# Patient Record
Sex: Female | Born: 1950
Health system: Southern US, Community
[De-identification: ages and names within clinical notes are randomized; demographics above are authoritative.]

## PROBLEM LIST (undated history)

## (undated) ENCOUNTER — Ambulatory Visit: Admission: EM | Payer: BC Managed Care – PPO | Source: Home / Self Care

## (undated) DIAGNOSIS — F32A Depression, unspecified: Secondary | ICD-10-CM

## (undated) DIAGNOSIS — J449 Chronic obstructive pulmonary disease, unspecified: Secondary | ICD-10-CM

## (undated) DIAGNOSIS — I10 Essential (primary) hypertension: Secondary | ICD-10-CM

## (undated) DIAGNOSIS — G2581 Restless legs syndrome: Secondary | ICD-10-CM

## (undated) DIAGNOSIS — E079 Disorder of thyroid, unspecified: Secondary | ICD-10-CM

## (undated) DIAGNOSIS — F419 Anxiety disorder, unspecified: Secondary | ICD-10-CM

## (undated) HISTORY — DX: Disorder of thyroid, unspecified: E07.9

## (undated) HISTORY — DX: Essential (primary) hypertension: I10

## (undated) HISTORY — DX: Restless legs syndrome: G25.81

---

## 2005-11-03 ENCOUNTER — Ambulatory Visit: Payer: Self-pay | Admitting: Family Medicine

## 2007-10-25 ENCOUNTER — Ambulatory Visit: Payer: Self-pay | Admitting: Family Medicine

## 2008-04-15 ENCOUNTER — Ambulatory Visit: Payer: Self-pay | Admitting: Family Medicine

## 2008-09-30 ENCOUNTER — Ambulatory Visit: Payer: Self-pay | Admitting: Internal Medicine

## 2008-10-07 ENCOUNTER — Ambulatory Visit: Payer: Self-pay | Admitting: Internal Medicine

## 2009-07-16 ENCOUNTER — Emergency Department: Payer: Self-pay | Admitting: Unknown Physician Specialty

## 2010-01-12 ENCOUNTER — Ambulatory Visit: Payer: Self-pay | Admitting: Family Medicine

## 2010-01-21 ENCOUNTER — Ambulatory Visit: Payer: Self-pay | Admitting: Family Medicine

## 2010-02-12 ENCOUNTER — Ambulatory Visit: Payer: Self-pay | Admitting: Family Medicine

## 2011-02-22 ENCOUNTER — Ambulatory Visit: Payer: Self-pay | Admitting: Family Medicine

## 2011-03-22 ENCOUNTER — Ambulatory Visit: Payer: Self-pay | Admitting: Family Medicine

## 2011-04-05 HISTORY — PX: COLONOSCOPY: SHX174

## 2011-08-18 ENCOUNTER — Ambulatory Visit: Payer: Self-pay | Admitting: Family Medicine

## 2011-10-19 ENCOUNTER — Emergency Department: Payer: Self-pay | Admitting: Unknown Physician Specialty

## 2011-10-19 LAB — COMPREHENSIVE METABOLIC PANEL
Albumin: 4.1 g/dL (ref 3.4–5.0)
Anion Gap: 14 (ref 7–16)
BUN: 14 mg/dL (ref 7–18)
Co2: 22 mmol/L (ref 21–32)
Creatinine: 1.08 mg/dL (ref 0.60–1.30)
EGFR (African American): >60
EGFR (Non-African Amer.): 56 — ABNORMAL LOW
Glucose: 123 mg/dL — ABNORMAL HIGH (ref 65–99)
Osmolality: 279 (ref 275–301)
Potassium: 3.9 mmol/L (ref 3.5–5.1)
SGOT(AST): 18 U/L (ref 15–37)
SGPT (ALT): 24 U/L
Total Protein: 7.5 g/dL (ref 6.4–8.2)

## 2011-10-19 LAB — URINALYSIS, COMPLETE
Bilirubin,UR: NEGATIVE
Glucose,UR: NEGATIVE mg/dL (ref 0–75)
Nitrite: POSITIVE
Protein: NEGATIVE
Specific Gravity: 1.013 (ref 1.003–1.030)
Squamous Epithelial: 3

## 2011-10-19 LAB — CBC
MCH: 31.7 pg (ref 26.0–34.0)
MCHC: 33.2 g/dL (ref 32.0–36.0)
MCV: 95 fL (ref 80–100)
RBC: 4.19 10*6/uL (ref 3.80–5.20)
RDW: 12.5 % (ref 11.5–14.5)

## 2011-10-20 LAB — CK TOTAL AND CKMB (NOT AT ARMC): CK, Total: 68 U/L (ref 21–215)

## 2011-10-20 LAB — TSH: Thyroid Stimulating Horm: 0.295 u[IU]/mL — ABNORMAL LOW

## 2011-10-20 LAB — TROPONIN I: Troponin-I: <0.02 ng/mL

## 2012-05-03 ENCOUNTER — Ambulatory Visit: Payer: Self-pay | Admitting: Family Medicine

## 2012-06-18 ENCOUNTER — Ambulatory Visit: Payer: Self-pay | Admitting: Family Medicine

## 2013-08-20 ENCOUNTER — Ambulatory Visit: Payer: Self-pay | Admitting: Family Medicine

## 2013-09-16 ENCOUNTER — Ambulatory Visit: Payer: Self-pay | Admitting: Unknown Physician Specialty

## 2013-09-19 LAB — PATHOLOGY REPORT

## 2013-10-25 ENCOUNTER — Ambulatory Visit: Payer: Self-pay | Admitting: Unknown Physician Specialty

## 2014-05-22 ENCOUNTER — Ambulatory Visit: Payer: Self-pay | Admitting: Family Medicine

## 2014-09-04 ENCOUNTER — Other Ambulatory Visit: Payer: Self-pay | Admitting: Family Medicine

## 2014-11-03 ENCOUNTER — Other Ambulatory Visit: Payer: Self-pay | Admitting: Family Medicine

## 2014-11-03 DIAGNOSIS — F17200 Nicotine dependence, unspecified, uncomplicated: Secondary | ICD-10-CM

## 2014-11-29 ENCOUNTER — Other Ambulatory Visit: Payer: Self-pay | Admitting: Family Medicine

## 2014-11-29 DIAGNOSIS — G2581 Restless legs syndrome: Secondary | ICD-10-CM

## 2014-11-29 DIAGNOSIS — Z716 Tobacco abuse counseling: Secondary | ICD-10-CM

## 2014-12-24 ENCOUNTER — Other Ambulatory Visit: Payer: Self-pay | Admitting: Family Medicine

## 2014-12-24 DIAGNOSIS — E039 Hypothyroidism, unspecified: Secondary | ICD-10-CM

## 2014-12-27 ENCOUNTER — Other Ambulatory Visit: Payer: Self-pay | Admitting: Family Medicine

## 2014-12-27 DIAGNOSIS — Z716 Tobacco abuse counseling: Secondary | ICD-10-CM

## 2014-12-28 ENCOUNTER — Other Ambulatory Visit: Payer: Self-pay | Admitting: Family Medicine

## 2014-12-28 DIAGNOSIS — G2581 Restless legs syndrome: Secondary | ICD-10-CM

## 2015-01-03 ENCOUNTER — Other Ambulatory Visit: Payer: Self-pay | Admitting: Family Medicine

## 2015-01-03 DIAGNOSIS — I10 Essential (primary) hypertension: Secondary | ICD-10-CM

## 2015-01-13 ENCOUNTER — Other Ambulatory Visit: Payer: Self-pay | Admitting: Family Medicine

## 2015-01-21 ENCOUNTER — Other Ambulatory Visit: Payer: Self-pay | Admitting: Family Medicine

## 2015-01-26 ENCOUNTER — Other Ambulatory Visit: Payer: Self-pay | Admitting: Family Medicine

## 2015-01-27 ENCOUNTER — Ambulatory Visit (INDEPENDENT_AMBULATORY_CARE_PROVIDER_SITE_OTHER): Payer: PRIVATE HEALTH INSURANCE | Admitting: Family Medicine

## 2015-01-27 ENCOUNTER — Encounter: Payer: Self-pay | Admitting: Family Medicine

## 2015-01-27 VITALS — BP 120/70 | HR 80 | Ht 66.0 in | Wt 143.0 lb

## 2015-01-27 DIAGNOSIS — G2581 Restless legs syndrome: Secondary | ICD-10-CM | POA: Insufficient documentation

## 2015-01-27 DIAGNOSIS — M5030 Other cervical disc degeneration, unspecified cervical region: Secondary | ICD-10-CM | POA: Diagnosis not present

## 2015-01-27 DIAGNOSIS — I10 Essential (primary) hypertension: Secondary | ICD-10-CM

## 2015-01-27 DIAGNOSIS — E039 Hypothyroidism, unspecified: Secondary | ICD-10-CM | POA: Diagnosis not present

## 2015-01-27 DIAGNOSIS — Z23 Encounter for immunization: Secondary | ICD-10-CM

## 2015-01-27 MED ORDER — PRAMIPEXOLE DIHYDROCHLORIDE 0.5 MG PO TABS: ORAL_TABLET | ORAL | Status: DC

## 2015-01-27 MED ORDER — METOPROLOL SUCCINATE ER 100 MG PO TB24: 100.0000 mg | ORAL_TABLET | Freq: Every day | ORAL | Status: DC

## 2015-01-27 MED ORDER — CYCLOBENZAPRINE HCL 10 MG PO TABS: 10.0000 mg | ORAL_TABLET | Freq: Every day | ORAL | Status: DC

## 2015-01-27 MED ORDER — LEVOTHYROXINE SODIUM 75 MCG PO TABS: ORAL_TABLET | ORAL | Status: DC

## 2015-01-27 NOTE — Patient Instructions (Signed)
Smoking Cessation, Tips for Success If you are ready to quit smoking, congratulations! You have chosen to help yourself be healthier. Cigarettes bring nicotine, tar, carbon monoxide, and other irritants into your body. Your lungs, heart, and blood vessels will be able to work better without these poisons. There are many different ways to quit smoking. Nicotine gum, nicotine patches, a nicotine inhaler, or nicotine nasal spray can help with physical craving. Hypnosis, support groups, and medicines help break the habit of smoking. WHAT THINGS CAN I DO TO MAKE QUITTING EASIER?  Here are some tips to help you quit for good:  Pick a date when you will quit smoking completely. Tell all of your friends and family about your plan to quit on that date.  Do not try to slowly cut down on the number of cigarettes you are smoking. Pick a quit date and quit smoking completely starting on that day.  Throw away all cigarettes.   Clean and remove all ashtrays from your home, work, and car.  On a card, write down your reasons for quitting. Carry the card with you and read it when you get the urge to smoke.  Cleanse your body of nicotine. Drink enough water and fluids to keep your urine clear or pale yellow. Do this after quitting to flush the nicotine from your body.  Learn to predict your moods. Do not let a bad situation be your excuse to have a cigarette. Some situations in your life might tempt you into wanting a cigarette.  Never have "just one" cigarette. It leads to wanting another and another. Remind yourself of your decision to quit.  Change habits associated with smoking. If you smoked while driving or when feeling stressed, try other activities to replace smoking. Stand up when drinking your coffee. Brush your teeth after eating. Sit in a different chair when you read the paper. Avoid alcohol while trying to quit, and try to drink fewer caffeinated beverages. Alcohol and caffeine may urge you to  smoke.  Avoid foods and drinks that can trigger a desire to smoke, such as sugary or spicy foods and alcohol.  Ask people who smoke not to smoke around you.  Have something planned to do right after eating or having a cup of coffee. For example, plan to take a walk or exercise.  Try a relaxation exercise to calm you down and decrease your stress. Remember, you may be tense and nervous for the first 2 weeks after you quit, but this will pass.  Find new activities to keep your hands busy. Play with a pen, coin, or rubber band. Doodle or draw things on paper.  Brush your teeth right after eating. This will help cut down on the craving for the taste of tobacco after meals. You can also try mouthwash.   Use oral substitutes in place of cigarettes. Try using lemon drops, carrots, cinnamon sticks, or chewing gum. Keep them handy so they are available when you have the urge to smoke.  When you have the urge to smoke, try deep breathing.  Designate your home as a nonsmoking area.  If you are a heavy smoker, ask your health care provider about a prescription for nicotine chewing gum. It can ease your withdrawal from nicotine.  Reward yourself. Set aside the cigarette money you save and buy yourself something nice.  Look for support from others. Join a support group or smoking cessation program. Ask someone at home or at work to help you with your plan   to quit smoking.  Always ask yourself, "Do I need this cigarette or is this just a reflex?" Tell yourself, "Today, I choose not to smoke," or "I do not want to smoke." You are reminding yourself of your decision to quit.  Do not replace cigarette smoking with electronic cigarettes (commonly called e-cigarettes). The safety of e-cigarettes is unknown, and some may contain harmful chemicals.  If you relapse, do not give up! Plan ahead and think about what you will do the next time you get the urge to smoke. HOW WILL I FEEL WHEN I QUIT SMOKING? You  may have symptoms of withdrawal because your body is used to nicotine (the addictive substance in cigarettes). You may crave cigarettes, be irritable, feel very hungry, cough often, get headaches, or have difficulty concentrating. The withdrawal symptoms are only temporary. They are strongest when you first quit but will go away within 10-14 days. When withdrawal symptoms occur, stay in control. Think about your reasons for quitting. Remind yourself that these are signs that your body is healing and getting used to being without cigarettes. Remember that withdrawal symptoms are easier to treat than the major diseases that smoking can cause.  Even after the withdrawal is over, expect periodic urges to smoke. However, these cravings are generally short lived and will go away whether you smoke or not. Do not smoke! WHAT RESOURCES ARE AVAILABLE TO HELP ME QUIT SMOKING? Your health care provider can direct you to community resources or hospitals for support, which may include:  Group support.  Education.  Hypnosis.  Therapy.   This information is not intended to replace advice given to you by your health care provider. Make sure you discuss any questions you have with your health care provider.   Document Released: 12/18/2003 Document Revised: 04/11/2014 Document Reviewed: 09/06/2012 Elsevier Interactive Patient Education 2016 [employer]  

## 2015-01-27 NOTE — Progress Notes (Signed)
Name: Diane Glenn [facility]   MRN: [id number]    DOB: [birth year]   Date:01/27/2015       Progress Note  Subjective  Chief Complaint  Chief Complaint  Patient presents with  . Hypertension  . Hypothyroidism  . restless leg    Hypertension This is a chronic problem. The current episode started more than 1 year ago. The problem has been waxing and waning since onset. The problem is controlled. Pertinent negatives include no anxiety, blurred vision, chest pain, headaches, malaise/fatigue, neck pain, orthopnea, palpitations, peripheral edema, PND, shortness of breath or sweats. There are no associated agents to hypertension. There are no known risk factors for coronary artery disease. Past treatments include beta blockers. The current treatment provides moderate improvement. There are no compliance problems.  Hypertensive end-organ damage includes a thyroid problem. There is no history of angina, kidney disease, CAD/MI, CVA, heart failure, left ventricular hypertrophy, PVD, renovascular disease or retinopathy. There is no history of chronic renal disease.  Thyroid Problem Presents for follow-up visit. Patient reports no anxiety, cold intolerance, constipation, depressed mood, diaphoresis, diarrhea, dry skin, fatigue, hair loss, heat intolerance, hoarse voice, leg swelling, menstrual problem, nail problem, palpitations, tremors, visual change, weight gain or weight loss. The symptoms have been stable. Past treatments include levothyroxine. There is no history of atrial fibrillation, dementia, diabetes, Graves' ophthalmopathy, heart failure, hyperlipidemia, neuropathy, obesity or osteopenia.  Other This is a chronic problem. The current episode started more than 1 year ago. Pertinent negatives include no abdominal pain, arthralgias, chest pain, chills, congestion, coughing, diaphoresis, fatigue, fever, headaches, myalgias, nausea, neck pain, rash, sore throat or visual change. The treatment provided mild  relief.  Back Pain This is a new problem. The current episode started 1 to 4 weeks ago. The problem has been gradually worsening since onset. Pain location: cervical spine. The quality of the pain is described as aching. The pain is at a severity of 2/10. Stiffness is present all day. Pertinent negatives include no abdominal pain, chest pain, dysuria, fever, headaches, tingling or weight loss.    No problem-specific assessment & plan notes found for this encounter.   Past Medical History  Diagnosis Date  . Thyroid disease   . Hypertension   . Restless leg syndrome     Past Surgical History  Procedure Laterality Date  . Colonoscopy  2013    cleared for 10 yrs- Dr Vira Agar?    Family History  Problem Relation Age of Onset  . Diabetes Father   . Stroke Father     Social History   Social History  . Marital Status: Married    Spouse Name: N/A  . Number of Children: N/A  . Years of Education: N/A   Occupational History  . Not on file.   Social History Main Topics  . Smoking status: Current Every Day Smoker  . Smokeless tobacco: Not on file  . Alcohol Use: No  . Drug Use: No  . Sexual Activity: Yes   Other Topics Concern  . Not on file   Social History Narrative  . No narrative on file    No Known Allergies   Review of Systems  Constitutional: Negative for fever, chills, weight loss, weight gain, malaise/fatigue, diaphoresis and fatigue.  HENT: Negative for congestion, ear discharge, ear pain, hoarse voice and sore throat.   Eyes: Negative for blurred vision.  Respiratory: Negative for cough, sputum production, shortness of breath and wheezing.   Cardiovascular: Negative for chest pain, palpitations, orthopnea, leg swelling  and PND.  Gastrointestinal: Negative for heartburn, nausea, abdominal pain, diarrhea, constipation, blood in stool and melena.  Genitourinary: Negative for dysuria, urgency, frequency, hematuria and menstrual problem.  Musculoskeletal:  Negative for myalgias, back pain, joint pain, arthralgias and neck pain.  Skin: Negative for rash.  Neurological: Negative for dizziness, tingling, tremors, sensory change, focal weakness and headaches.  Endo/Heme/Allergies: Negative for environmental allergies, cold intolerance, heat intolerance and polydipsia. Does not bruise/bleed easily.  Psychiatric/Behavioral: Negative for depression and suicidal ideas. The patient is not nervous/anxious and does not have insomnia.      Objective  Filed Vitals:   01/27/15 1346  BP: 120/70  Pulse: 80  Height: 5\' 6"  (1.676 m)  Weight: 143 lb (64.864 kg)    Physical Exam  Constitutional: She is well-developed, well-nourished, and in no distress. No distress.  HENT:  Head: Normocephalic and atraumatic.  Right Ear: External ear normal.  Left Ear: External ear normal.  Nose: Nose normal.  Mouth/Throat: Oropharynx is clear and moist.  Eyes: Conjunctivae and EOM are normal. Pupils are equal, round, and reactive to light. Right eye exhibits no discharge. Left eye exhibits no discharge.  Neck: Normal range of motion. Neck supple. No JVD present. No thyromegaly present.  Cardiovascular: Normal rate, regular rhythm, normal heart sounds and intact distal pulses.  Exam reveals no gallop and no friction rub.   No murmur heard. Pulmonary/Chest: Effort normal and breath sounds normal.  Abdominal: Soft. Bowel sounds are normal. She exhibits no mass. There is no tenderness. There is no guarding.  Musculoskeletal: Normal range of motion. She exhibits no edema.  Lymphadenopathy:    She has no cervical adenopathy.  Neurological: She is alert. She has normal sensation, normal strength and normal reflexes.  Skin: Skin is warm and dry. She is not diaphoretic.  Psychiatric: Mood and affect normal.  Nursing note and vitals reviewed.     Assessment & Plan  Problem List Items Addressed This Visit      Cardiovascular and Mediastinum   Essential hypertension -  Primary   Relevant Medications   metoprolol succinate (TOPROL-XL) 100 MG 24 hr tablet   Other Relevant Orders   Renal Function Panel     Endocrine   Thyroid activity decreased   Relevant Medications   levothyroxine (SYNTHROID, LEVOTHROID) 75 MCG tablet   metoprolol succinate (TOPROL-XL) 100 MG 24 hr tablet   Other Relevant Orders   TSH     Musculoskeletal and Integument   Degenerative disc disease, cervical   Relevant Medications   cyclobenzaprine (FLEXERIL) 10 MG tablet     Other   Restless legs syndrome   Relevant Medications   pramipexole (MIRAPEX) 0.5 MG tablet    Other Visit Diagnoses    Need for influenza vaccination        Relevant Orders    Flu Vaccine QUAD 36+ mos PF IM (Fluarix & Fluzone Quad PF) (Completed)         Dr. Ladonna Vanorder [facility]  01/27/2015

## 2015-01-28 LAB — RENAL FUNCTION PANEL
ALBUMIN: 4.5 g/dL (ref 3.6–4.8)
BUN/Creatinine Ratio: 12 (ref 11–26)
BUN: 9 mg/dL (ref 8–27)
CALCIUM: 9.8 mg/dL (ref 8.7–10.3)
CO2: 26 mmol/L (ref 18–29)
CREATININE: 0.76 mg/dL (ref 0.57–1.00)
Chloride: 98 mmol/L (ref 97–106)
GFR calc Af Amer: 97 mL/min/{1.73_m2} (ref >59)
GFR calc non Af Amer: 84 mL/min/{1.73_m2} (ref >59)
Glucose: 92 mg/dL (ref 65–99)
PHOSPHORUS: 3.5 mg/dL (ref 2.5–4.5)
POTASSIUM: 4.7 mmol/L (ref 3.5–5.2)
SODIUM: 140 mmol/L (ref 136–144)

## 2015-01-28 LAB — TSH: TSH: 0.927 u[IU]/mL (ref 0.450–4.500)

## 2015-03-23 ENCOUNTER — Other Ambulatory Visit: Payer: Self-pay | Admitting: Family Medicine

## 2015-03-26 ENCOUNTER — Encounter: Payer: Self-pay | Admitting: Family Medicine

## 2015-03-26 ENCOUNTER — Ambulatory Visit (INDEPENDENT_AMBULATORY_CARE_PROVIDER_SITE_OTHER): Payer: PRIVATE HEALTH INSURANCE | Admitting: Family Medicine

## 2015-03-26 VITALS — BP 130/80 | HR 64 | Ht 66.0 in | Wt 148.0 lb

## 2015-03-26 DIAGNOSIS — J40 Bronchitis, not specified as acute or chronic: Secondary | ICD-10-CM

## 2015-03-26 MED ORDER — AMOXICILLIN 500 MG PO CAPS
500.0000 mg | ORAL_CAPSULE | Freq: Three times a day (TID) | ORAL | Status: DC
Start: 2015-03-26 — End: 2015-07-16

## 2015-03-26 NOTE — Progress Notes (Signed)
Name: Diane Glenn [facility]   MRN: [id number]    DOB: 05-04-1950   Date:03/26/2015       Progress Note  Subjective  Chief Complaint  Chief Complaint  Patient presents with  . Sinusitis    drainage, runny nose, cough- clear/ thick production    Sinusitis This is a new problem. The current episode started 1 to 4 weeks ago. The problem has been waxing and waning since onset. There has been no fever (low grade earlier). The pain is mild. Associated symptoms include chills, congestion, headaches, sinus pressure and a sore throat. Pertinent negatives include no coughing, diaphoresis, ear pain, hoarse voice, neck pain or shortness of breath. Past treatments include nothing. The treatment provided no relief.    No problem-specific assessment & plan notes found for this encounter.   Past Medical History  Diagnosis Date  . Thyroid disease   . Hypertension   . Restless leg syndrome     Past Surgical History  Procedure Laterality Date  . Colonoscopy  2013    cleared for 10 yrs- Dr Vira Agar?    Family History  Problem Relation Age of Onset  . Diabetes Father   . Stroke Father     Social History   Social History  . Marital Status: Married    Spouse Name: N/A  . Number of Children: N/A  . Years of Education: N/A   Occupational History  . Not on file.   Social History Main Topics  . Smoking status: Current Every Day Smoker  . Smokeless tobacco: Not on file  . Alcohol Use: No  . Drug Use: No  . Sexual Activity: Yes   Other Topics Concern  . Not on file   Social History Narrative    No Known Allergies   Review of Systems  Constitutional: Positive for chills. Negative for fever, weight loss, malaise/fatigue and diaphoresis.  HENT: Positive for congestion, sinus pressure and sore throat. Negative for ear discharge, ear pain and hoarse voice.   Eyes: Negative for blurred vision.  Respiratory: Negative for cough, sputum production, shortness of breath and wheezing.    Cardiovascular: Negative for chest pain, palpitations and leg swelling.  Gastrointestinal: Negative for heartburn, nausea, abdominal pain, diarrhea, constipation, blood in stool and melena.  Genitourinary: Negative for dysuria, urgency, frequency and hematuria.  Musculoskeletal: Negative for myalgias, back pain, joint pain and neck pain.  Skin: Negative for rash.  Neurological: Positive for headaches. Negative for dizziness, tingling, sensory change and focal weakness.  Endo/Heme/Allergies: Negative for environmental allergies and polydipsia. Does not bruise/bleed easily.  Psychiatric/Behavioral: Negative for depression and suicidal ideas. The patient is not nervous/anxious and does not have insomnia.      Objective  Filed Vitals:   03/26/15 0936  BP: 130/80  Pulse: 64  Height: 5\' 6"  (1.676 m)  Weight: 148 lb (67.132 kg)    Physical Exam  Constitutional: She is well-developed, well-nourished, and in no distress. No distress.  HENT:  Head: Normocephalic and atraumatic.  Right Ear: External ear normal.  Left Ear: External ear normal.  Nose: Nose normal.  Mouth/Throat: Oropharynx is clear and moist.  Eyes: Conjunctivae and EOM are normal. Pupils are equal, round, and reactive to light. Right eye exhibits no discharge. Left eye exhibits no discharge.  Neck: Normal range of motion. Neck supple. No JVD present. No thyromegaly present.  Cardiovascular: Normal rate, regular rhythm, normal heart sounds and intact distal pulses.  Exam reveals no gallop and no friction rub.   No murmur  heard. Pulmonary/Chest: Effort normal and breath sounds normal.  Abdominal: Soft. Bowel sounds are normal. She exhibits no mass. There is no tenderness. There is no guarding.  Musculoskeletal: Normal range of motion. She exhibits no edema.  Lymphadenopathy:    She has no cervical adenopathy.  Neurological: She is alert. She has normal reflexes.  Skin: Skin is warm and dry. She is not diaphoretic.   Psychiatric: Mood and affect normal.  Nursing note and vitals reviewed.     Assessment & Plan  Problem List Items Addressed This Visit    None    Visit Diagnoses    Bronchitis    -  Primary    Relevant Medications    amoxicillin (AMOXIL) 500 MG capsule         Dr. [facility]  03/26/2015

## 2015-03-27 ENCOUNTER — Other Ambulatory Visit: Payer: Self-pay | Admitting: Family Medicine

## 2015-06-14 ENCOUNTER — Other Ambulatory Visit: Payer: Self-pay | Admitting: Family Medicine

## 2015-06-19 ENCOUNTER — Other Ambulatory Visit: Payer: Self-pay | Admitting: Family Medicine

## 2015-07-16 ENCOUNTER — Ambulatory Visit (INDEPENDENT_AMBULATORY_CARE_PROVIDER_SITE_OTHER): Payer: PRIVATE HEALTH INSURANCE | Admitting: Family Medicine

## 2015-07-16 ENCOUNTER — Encounter: Payer: Self-pay | Admitting: Family Medicine

## 2015-07-16 VITALS — BP 130/78 | HR 68 | Ht 66.0 in | Wt 147.0 lb

## 2015-07-16 DIAGNOSIS — G2581 Restless legs syndrome: Secondary | ICD-10-CM

## 2015-07-16 DIAGNOSIS — J0100 Acute maxillary sinusitis, unspecified: Secondary | ICD-10-CM | POA: Diagnosis not present

## 2015-07-16 DIAGNOSIS — J40 Bronchitis, not specified as acute or chronic: Secondary | ICD-10-CM

## 2015-07-16 DIAGNOSIS — E785 Hyperlipidemia, unspecified: Secondary | ICD-10-CM | POA: Diagnosis not present

## 2015-07-16 DIAGNOSIS — E039 Hypothyroidism, unspecified: Secondary | ICD-10-CM | POA: Diagnosis not present

## 2015-07-16 DIAGNOSIS — I10 Essential (primary) hypertension: Secondary | ICD-10-CM

## 2015-07-16 DIAGNOSIS — M5030 Other cervical disc degeneration, unspecified cervical region: Secondary | ICD-10-CM | POA: Diagnosis not present

## 2015-07-16 MED ORDER — LEVOTHYROXINE SODIUM 75 MCG PO TABS: ORAL_TABLET | ORAL | Status: DC

## 2015-07-16 MED ORDER — AMOXICILLIN 500 MG PO CAPS: 500.0000 mg | ORAL_CAPSULE | Freq: Three times a day (TID) | ORAL | Status: DC

## 2015-07-16 MED ORDER — CYCLOBENZAPRINE HCL 10 MG PO TABS: ORAL_TABLET | ORAL | Status: DC

## 2015-07-16 MED ORDER — PRAMIPEXOLE DIHYDROCHLORIDE 0.5 MG PO TABS: ORAL_TABLET | ORAL | Status: DC

## 2015-07-16 MED ORDER — METOPROLOL SUCCINATE ER 100 MG PO TB24: 100.0000 mg | ORAL_TABLET | Freq: Every day | ORAL | Status: DC

## 2015-07-16 NOTE — Progress Notes (Signed)
Name: Diane Glenn [facility]   MRN: [id number]    DOB: Mar 27, 1951   Date:07/16/2015       Progress Note  Subjective  Chief Complaint  Chief Complaint  Patient presents with  . Hypertension  . Hypothyroidism  . restless leg  . Sinusitis    cough and cong    HPI Comments: Patient presents for medication refills for RLS and chronic cervical strain.  Hypertension This is a chronic problem. The current episode started more than 1 year ago. The problem has been gradually improving since onset. The problem is controlled. Associated symptoms include malaise/fatigue. Pertinent negatives include no anxiety, blurred vision, chest pain, headaches, neck pain, orthopnea, palpitations, peripheral edema, PND, shortness of breath or sweats. There are no associated agents to hypertension. There are no known risk factors for coronary artery disease. Past treatments include beta blockers. The current treatment provides moderate improvement. There are no compliance problems.  Hypertensive end-organ damage includes a thyroid problem. There is no history of angina, kidney disease, CAD/MI, CVA, heart failure, left ventricular hypertrophy, PVD, renovascular disease or retinopathy. There is no history of chronic renal disease or a hypertension causing med.  Sinusitis This is a new problem. The current episode started more than 1 year ago. The problem has been waxing and waning since onset. There has been no fever. The pain is mild. Associated symptoms include chills, congestion, coughing, sinus pressure and sneezing. Pertinent negatives include no diaphoresis, ear pain, headaches, hoarse voice, neck pain, shortness of breath or sore throat. Past treatments include acetaminophen and oral decongestants. The treatment provided mild relief.  Thyroid Problem Presents for follow-up visit. Symptoms include fatigue. Patient reports no anxiety, cold intolerance, constipation, depressed mood, diaphoresis, diarrhea, dry skin, hair  loss, heat intolerance, hoarse voice, leg swelling, menstrual problem, nail problem, palpitations, tremors, visual change, weight gain or weight loss. The symptoms have been stable. Past treatments include levothyroxine. There is no history of heart failure.    No problem-specific assessment & plan notes found for this encounter.   Past Medical History  Diagnosis Date  . Thyroid disease   . Hypertension   . Restless leg syndrome     Past Surgical History  Procedure Laterality Date  . Colonoscopy  2013    cleared for 10 yrs- Dr Vira Agar?    Family History  Problem Relation Age of Onset  . Diabetes Father   . Stroke Father     Social History   Social History  . Marital Status: Married    Spouse Name: N/A  . Number of Children: N/A  . Years of Education: N/A   Occupational History  . Not on file.   Social History Main Topics  . Smoking status: Current Every Day Smoker  . Smokeless tobacco: Not on file  . Alcohol Use: No  . Drug Use: No  . Sexual Activity: Yes   Other Topics Concern  . Not on file   Social History Narrative    No Known Allergies   Review of Systems  Constitutional: Positive for chills, malaise/fatigue and fatigue. Negative for fever, weight loss, weight gain and diaphoresis.  HENT: Positive for congestion, sinus pressure and sneezing. Negative for ear discharge, ear pain, hoarse voice and sore throat.   Eyes: Negative for blurred vision.  Respiratory: Positive for cough. Negative for sputum production, shortness of breath and wheezing.   Cardiovascular: Negative for chest pain, palpitations, orthopnea, leg swelling and PND.  Gastrointestinal: Negative for heartburn, nausea, abdominal pain, diarrhea, constipation, blood  in stool and melena.  Genitourinary: Negative for dysuria, urgency, frequency, hematuria and menstrual problem.  Musculoskeletal: Negative for myalgias, back pain, joint pain and neck pain.  Skin: Negative for rash.   Neurological: Negative for dizziness, tingling, tremors, sensory change, focal weakness and headaches.  Endo/Heme/Allergies: Negative for environmental allergies, cold intolerance, heat intolerance and polydipsia. Does not bruise/bleed easily.  Psychiatric/Behavioral: Negative for depression and suicidal ideas. The patient is not nervous/anxious and does not have insomnia.      Objective  Filed Vitals:   07/16/15 1423  BP: 130/78  Pulse: 68  Height: 5\' 6"  (1.676 m)  Weight: 147 lb (66.679 kg)    Physical Exam  Constitutional: She is well-developed, well-nourished, and in no distress. No distress.  HENT:  Head: Normocephalic and atraumatic.  Right Ear: Tympanic membrane, external ear and ear canal normal.  Left Ear: Tympanic membrane, external ear and ear canal normal.  Nose: Nose normal.  Mouth/Throat: Oropharynx is clear and moist.  Eyes: Conjunctivae and EOM are normal. Pupils are equal, round, and reactive to light. Right eye exhibits no discharge. Left eye exhibits no discharge.  Neck: Normal range of motion. Neck supple. No JVD present. No thyromegaly present.  Cardiovascular: Normal rate, regular rhythm, normal heart sounds and intact distal pulses.  Exam reveals no gallop and no friction rub.   No murmur heard. Pulmonary/Chest: Effort normal and breath sounds normal.  Abdominal: Soft. Bowel sounds are normal. She exhibits no mass. There is no tenderness. There is no guarding.  Musculoskeletal: Normal range of motion. She exhibits no edema.  Lymphadenopathy:    She has no cervical adenopathy.  Neurological: She is alert.  Skin: Skin is warm and dry. She is not diaphoretic.  Psychiatric: Mood and affect normal.  Nursing note and vitals reviewed.     Assessment & Plan  Problem List Items Addressed This Visit      Cardiovascular and Mediastinum   Essential hypertension - Primary   Relevant Medications   metoprolol succinate (TOPROL-XL) 100 MG 24 hr tablet   Other  Relevant Orders   Renal Function Panel     Endocrine   Thyroid activity decreased   Relevant Medications   levothyroxine (SYNTHROID, LEVOTHROID) 75 MCG tablet   metoprolol succinate (TOPROL-XL) 100 MG 24 hr tablet   Other Relevant Orders   TSH     Musculoskeletal and Integument   Degenerative disc disease, cervical   Relevant Medications   cyclobenzaprine (FLEXERIL) 10 MG tablet     Other   Restless legs syndrome   Relevant Medications   pramipexole (MIRAPEX) 0.5 MG tablet    Other Visit Diagnoses    Acute maxillary sinusitis, recurrence not specified        Relevant Medications    amoxicillin (AMOXIL) 500 MG capsule    Bronchitis        Relevant Medications    amoxicillin (AMOXIL) 500 MG capsule    Hyperlipidemia        Relevant Medications    metoprolol succinate (TOPROL-XL) 100 MG 24 hr tablet    Other Relevant Orders    Lipid Profile    TSH         Dr. Emmajo [facility]  07/16/2015

## 2015-07-17 LAB — RENAL FUNCTION PANEL
Albumin: 4.4 g/dL (ref 3.6–4.8)
BUN/Creatinine Ratio: 13 (ref 12–28)
BUN: 10 mg/dL (ref 8–27)
CO2: 25 mmol/L (ref 18–29)
CREATININE: 0.77 mg/dL (ref 0.57–1.00)
Calcium: 9.6 mg/dL (ref 8.7–10.3)
Chloride: 96 mmol/L (ref 96–106)
GFR calc non Af Amer: 82 mL/min/{1.73_m2} (ref >59)
GFR, EST AFRICAN AMERICAN: 94 mL/min/{1.73_m2} (ref >59)
Glucose: 57 mg/dL — ABNORMAL LOW (ref 65–99)
Phosphorus: 3 mg/dL (ref 2.5–4.5)
Potassium: 4.3 mmol/L (ref 3.5–5.2)
SODIUM: 137 mmol/L (ref 134–144)

## 2015-07-17 LAB — TSH: TSH: 2.76 u[IU]/mL (ref 0.450–4.500)

## 2015-07-17 LAB — LIPID PANEL
Chol/HDL Ratio: 4 ratio units (ref 0.0–4.4)
Cholesterol, Total: 194 mg/dL (ref 100–199)
HDL: 49 mg/dL (ref >39)
LDL CALC: 101 mg/dL — AB (ref 0–99)
Triglycerides: 222 mg/dL — ABNORMAL HIGH (ref 0–149)
VLDL CHOLESTEROL CAL: 44 mg/dL — AB (ref 5–40)

## 2015-07-28 ENCOUNTER — Encounter: Payer: Self-pay | Admitting: Family Medicine

## 2015-07-28 ENCOUNTER — Ambulatory Visit (INDEPENDENT_AMBULATORY_CARE_PROVIDER_SITE_OTHER): Payer: PRIVATE HEALTH INSURANCE | Admitting: Family Medicine

## 2015-07-28 VITALS — BP 130/80 | HR 78 | Ht 66.0 in | Wt 148.0 lb

## 2015-07-28 DIAGNOSIS — E039 Hypothyroidism, unspecified: Secondary | ICD-10-CM | POA: Diagnosis not present

## 2015-07-28 DIAGNOSIS — M5030 Other cervical disc degeneration, unspecified cervical region: Secondary | ICD-10-CM

## 2015-07-28 DIAGNOSIS — I10 Essential (primary) hypertension: Secondary | ICD-10-CM | POA: Diagnosis not present

## 2015-07-28 DIAGNOSIS — G2581 Restless legs syndrome: Secondary | ICD-10-CM | POA: Diagnosis not present

## 2015-07-28 MED ORDER — PRAMIPEXOLE DIHYDROCHLORIDE 0.5 MG PO TABS: ORAL_TABLET | ORAL | Status: DC

## 2015-07-28 MED ORDER — METOPROLOL SUCCINATE ER 100 MG PO TB24: 100.0000 mg | ORAL_TABLET | Freq: Every day | ORAL | Status: DC

## 2015-07-28 MED ORDER — LEVOTHYROXINE SODIUM 75 MCG PO TABS
ORAL_TABLET | ORAL | Status: DC
Start: 2015-07-28 — End: 2016-03-10

## 2015-07-28 MED ORDER — CYCLOBENZAPRINE HCL 10 MG PO TABS: ORAL_TABLET | ORAL | Status: DC

## 2015-07-28 NOTE — Progress Notes (Signed)
Name: Diane Glenn [facility]   MRN: [id number]    DOB: 09-30-1950   Date:07/28/2015       Progress Note  Subjective  Chief Complaint  Chief Complaint  Patient presents with  . Hypertension  . Hypothyroidism  . restless leg  . Spasms    takes Flexeril    HPI Comments: Patient presents for refill medication for restless leg.  Hypertension This is a chronic problem. The current episode started more than 1 year ago. The problem has been gradually improving since onset. The problem is controlled. Pertinent negatives include no anxiety, blurred vision, chest pain, headaches, malaise/fatigue, neck pain, orthopnea, palpitations, peripheral edema, PND, shortness of breath or sweats. There are no associated agents to hypertension. Risk factors for coronary artery disease include smoking/tobacco exposure and dyslipidemia. Past treatments include beta blockers. The current treatment provides mild improvement. There are no compliance problems.  Hypertensive end-organ damage includes a thyroid problem. There is no history of angina, kidney disease, CAD/MI, CVA, heart failure, left ventricular hypertrophy, PVD, renovascular disease or retinopathy. There is no history of chronic renal disease or a hypertension causing med.  Thyroid Problem Presents for follow-up visit. Patient reports no anxiety, cold intolerance, constipation, depressed mood, diaphoresis, diarrhea, dry skin, fatigue, hair loss, heat intolerance, hoarse voice, leg swelling, menstrual problem, nail problem, palpitations, tremors, visual change, weight gain or weight loss. The symptoms have been stable. Past treatments include levothyroxine. The treatment provided mild relief. There is no history of atrial fibrillation, dementia or heart failure.  Muscle Pain This is a recurrent problem. The current episode started more than 1 month ago. The problem has been waxing and waning since onset. The pain is present in the lower back. The pain is mild.  Nothing aggravates the symptoms. Pertinent negatives include no abdominal pain, chest pain, constipation, diarrhea, dysuria, fatigue, fever, headaches, nausea, rash, sensory change, shortness of breath, visual change or wheezing. Treatments tried: muscle relaxant. The treatment provided mild relief.    No problem-specific assessment & plan notes found for this encounter.   Past Medical History  Diagnosis Date  . Thyroid disease   . Hypertension   . Restless leg syndrome     Past Surgical History  Procedure Laterality Date  . Colonoscopy  2013    cleared for 10 yrs- Dr Vira Agar?    Family History  Problem Relation Age of Onset  . Diabetes Father   . Stroke Father     Social History   Social History  . Marital Status: Married    Spouse Name: N/A  . Number of Children: N/A  . Years of Education: N/A   Occupational History  . Not on file.   Social History Main Topics  . Smoking status: Current Every Day Smoker  . Smokeless tobacco: Not on file  . Alcohol Use: No  . Drug Use: No  . Sexual Activity: Yes   Other Topics Concern  . Not on file   Social History Narrative    No Known Allergies   Review of Systems  Constitutional: Negative for fever, chills, weight loss, weight gain, malaise/fatigue, diaphoresis and fatigue.  HENT: Negative for ear discharge, ear pain, hoarse voice and sore throat.   Eyes: Negative for blurred vision.  Respiratory: Negative for cough, sputum production, shortness of breath and wheezing.   Cardiovascular: Negative for chest pain, palpitations, orthopnea, leg swelling and PND.  Gastrointestinal: Negative for heartburn, nausea, abdominal pain, diarrhea, constipation, blood in stool and melena.  Genitourinary: Negative for dysuria,  urgency, frequency, hematuria and menstrual problem.  Musculoskeletal: Positive for back pain. Negative for myalgias, joint pain and neck pain.  Skin: Negative for rash.  Neurological: Negative for dizziness,  tingling, tremors, sensory change, focal weakness and headaches.  Endo/Heme/Allergies: Negative for environmental allergies, cold intolerance, heat intolerance and polydipsia. Does not bruise/bleed easily.  Psychiatric/Behavioral: Negative for depression and suicidal ideas. The patient is not nervous/anxious and does not have insomnia.      Objective  Filed Vitals:   07/28/15 1404  BP: 130/80  Pulse: 78  Height: 5\' 6"  (1.676 m)  Weight: 148 lb (67.132 kg)    Physical Exam  Constitutional: She is well-developed, well-nourished, and in no distress. No distress.  HENT:  Head: Normocephalic and atraumatic.  Right Ear: External ear normal.  Left Ear: External ear normal.  Nose: Nose normal.  Mouth/Throat: Oropharynx is clear and moist.  Eyes: Conjunctivae and EOM are normal. Pupils are equal, round, and reactive to light. Right eye exhibits no discharge. Left eye exhibits no discharge.  Neck: Normal range of motion. Neck supple. No JVD present. No thyromegaly present.  Cardiovascular: Normal rate, regular rhythm, normal heart sounds and intact distal pulses.  Exam reveals no gallop and no friction rub.   No murmur heard. Pulmonary/Chest: Effort normal and breath sounds normal.  Abdominal: Soft. Bowel sounds are normal. She exhibits no mass. There is no tenderness. There is no guarding.  Musculoskeletal: Normal range of motion. She exhibits no edema.  Lymphadenopathy:    She has no cervical adenopathy.  Neurological: She is alert. She has normal reflexes.  Skin: Skin is warm and dry. She is not diaphoretic.  Psychiatric: Mood and affect normal.  Nursing note and vitals reviewed.     Assessment & Plan  Problem List Items Addressed This Visit      Cardiovascular and Mediastinum   Essential hypertension - Primary   Relevant Medications   metoprolol succinate (TOPROL-XL) 100 MG 24 hr tablet     Endocrine   Thyroid activity decreased   Relevant Medications   levothyroxine  (SYNTHROID, LEVOTHROID) 75 MCG tablet   metoprolol succinate (TOPROL-XL) 100 MG 24 hr tablet     Musculoskeletal and Integument   Degenerative disc disease, cervical   Relevant Medications   cyclobenzaprine (FLEXERIL) 10 MG tablet     Other   Restless legs syndrome   Relevant Medications   pramipexole (MIRAPEX) 0.5 MG tablet        Dr. Deanna Jones [facility]  07/28/2015

## 2015-09-19 ENCOUNTER — Other Ambulatory Visit: Payer: Self-pay | Admitting: Family Medicine

## 2015-09-23 ENCOUNTER — Other Ambulatory Visit: Payer: Self-pay | Admitting: Family Medicine

## 2015-09-30 ENCOUNTER — Other Ambulatory Visit: Payer: Self-pay | Admitting: Family Medicine

## 2015-10-09 ENCOUNTER — Encounter: Payer: Self-pay | Admitting: Family Medicine

## 2015-10-09 ENCOUNTER — Ambulatory Visit (INDEPENDENT_AMBULATORY_CARE_PROVIDER_SITE_OTHER): Payer: PRIVATE HEALTH INSURANCE | Admitting: Family Medicine

## 2015-10-09 VITALS — BP 140/88 | HR 64 | Ht 66.0 in | Wt 153.0 lb

## 2015-10-09 DIAGNOSIS — J40 Bronchitis, not specified as acute or chronic: Secondary | ICD-10-CM

## 2015-10-09 DIAGNOSIS — J0100 Acute maxillary sinusitis, unspecified: Secondary | ICD-10-CM

## 2015-10-09 MED ORDER — GUAIFENESIN-CODEINE 100-10 MG/5ML PO SYRP: 5.0000 mL | ORAL_SOLUTION | Freq: Three times a day (TID) | ORAL | Status: DC | PRN

## 2015-10-09 MED ORDER — AMOXICILLIN-POT CLAVULANATE 875-125 MG PO TABS: 1.0000 | ORAL_TABLET | Freq: Two times a day (BID) | ORAL | Status: DC

## 2015-10-09 NOTE — Progress Notes (Signed)
Name: Diane Glenn [facility]   MRN: [id number]    DOB: November 07, 1950   Date:10/09/2015       Progress Note  Subjective  Chief Complaint  Chief Complaint  Patient presents with  . Sinusitis    cough and cong    Sinusitis This is a chronic problem. The current episode started more than 1 year ago. The problem has been waxing and waning since onset. There has been no fever. The pain is moderate. Associated symptoms include congestion, sinus pressure and a sore throat. Pertinent negatives include no chills, coughing, diaphoresis, ear pain, headaches, hoarse voice, neck pain, shortness of breath, sneezing or swollen glands. Past treatments include nothing. The treatment provided mild relief.    No problem-specific assessment & plan notes found for this encounter.   Past Medical History  Diagnosis Date  . Thyroid disease   . Hypertension   . Restless leg syndrome     Past Surgical History  Procedure Laterality Date  . Colonoscopy  2013    cleared for 10 yrs- Dr Vira Agar?    Family History  Problem Relation Age of Onset  . Diabetes Father   . Stroke Father     Social History   Social History  . Marital Status: Married    Spouse Name: N/A  . Number of Children: N/A  . Years of Education: N/A   Occupational History  . Not on file.   Social History Main Topics  . Smoking status: Current Every Day Smoker  . Smokeless tobacco: Not on file  . Alcohol Use: No  . Drug Use: No  . Sexual Activity: Yes   Other Topics Concern  . Not on file   Social History Narrative    No Known Allergies   Review of Systems  Constitutional: Negative for fever, chills, weight loss, malaise/fatigue and diaphoresis.  HENT: Positive for congestion, sinus pressure and sore throat. Negative for ear discharge, ear pain, hoarse voice and sneezing.   Eyes: Negative for blurred vision.  Respiratory: Negative for cough, sputum production, shortness of breath and wheezing.   Cardiovascular: Negative for  chest pain, palpitations and leg swelling.  Gastrointestinal: Negative for heartburn, nausea, abdominal pain, diarrhea, constipation, blood in stool and melena.  Genitourinary: Negative for dysuria, urgency, frequency and hematuria.  Musculoskeletal: Negative for myalgias, back pain, joint pain and neck pain.  Skin: Negative for rash.  Neurological: Negative for dizziness, tingling, sensory change, focal weakness and headaches.  Endo/Heme/Allergies: Negative for environmental allergies and polydipsia. Does not bruise/bleed easily.  Psychiatric/Behavioral: Negative for depression and suicidal ideas. The patient is not nervous/anxious and does not have insomnia.      Objective  Filed Vitals:   10/09/15 1549  BP: 140/88  Pulse: 64  Height: 5\' 6"  (1.676 m)  Weight: 153 lb (69.4 kg)    Physical Exam  Constitutional: She is well-developed, well-nourished, and in no distress. No distress.  HENT:  Head: Normocephalic and atraumatic.  Right Ear: External ear normal.  Left Ear: External ear normal.  Nose: Nose normal.  Mouth/Throat: Oropharynx is clear and moist.  Eyes: Conjunctivae and EOM are normal. Pupils are equal, round, and reactive to light. Right eye exhibits no discharge. Left eye exhibits no discharge.  Neck: Normal range of motion. Neck supple. No JVD present. No thyromegaly present.  Cardiovascular: Normal rate, regular rhythm, normal heart sounds and intact distal pulses.  Exam reveals no gallop and no friction rub.   No murmur heard. Pulmonary/Chest: Effort normal and breath sounds  normal.  Abdominal: Soft. Bowel sounds are normal. She exhibits no mass. There is no tenderness. There is no guarding.  Musculoskeletal: Normal range of motion. She exhibits no edema.  Lymphadenopathy:    She has no cervical adenopathy.  Neurological: She is alert. She has normal reflexes.  Skin: Skin is warm and dry. She is not diaphoretic.  Psychiatric: Mood and affect normal.  Nursing note  and vitals reviewed.     Assessment & Plan  Problem List Items Addressed This Visit    None    Visit Diagnoses    Acute maxillary sinusitis, recurrence not specified    -  Primary    Relevant Medications    amoxicillin-clavulanate (AUGMENTIN) 875-125 MG tablet    guaiFENesin-codeine (ROBITUSSIN AC) 100-10 MG/5ML syrup    Bronchitis        Relevant Medications    amoxicillin-clavulanate (AUGMENTIN) 875-125 MG tablet    guaiFENesin-codeine (ROBITUSSIN AC) 100-10 MG/5ML syrup         Dr. Dannilynn Gallina [facility]  10/09/2015

## 2015-10-13 ENCOUNTER — Other Ambulatory Visit: Payer: Self-pay | Admitting: Family Medicine

## 2015-10-15 ENCOUNTER — Other Ambulatory Visit: Payer: Self-pay

## 2015-10-15 MED ORDER — LEVOFLOXACIN 500 MG PO TABS: 500.0000 mg | ORAL_TABLET | Freq: Every day | ORAL | Status: DC

## 2015-10-19 ENCOUNTER — Other Ambulatory Visit: Payer: Self-pay | Admitting: Family Medicine

## 2015-12-23 ENCOUNTER — Other Ambulatory Visit: Payer: Self-pay | Admitting: Family Medicine

## 2015-12-24 ENCOUNTER — Other Ambulatory Visit: Payer: Self-pay | Admitting: Family Medicine

## 2016-01-02 ENCOUNTER — Encounter: Payer: Self-pay | Admitting: Emergency Medicine

## 2016-01-02 ENCOUNTER — Emergency Department: Payer: Self-pay

## 2016-01-02 ENCOUNTER — Emergency Department
Admission: EM | Admit: 2016-01-02 | Discharge: 2016-01-02 | Disposition: A | Discharge status: Home / Self Care | Payer: Self-pay | Attending: Emergency Medicine | Admitting: Emergency Medicine

## 2016-01-02 DIAGNOSIS — S92301A Fracture of unspecified metatarsal bone(s), right foot, initial encounter for closed fracture: Secondary | ICD-10-CM

## 2016-01-02 DIAGNOSIS — W19XXXA Unspecified fall, initial encounter: Secondary | ICD-10-CM

## 2016-01-02 DIAGNOSIS — X501XXA Overexertion from prolonged static or awkward postures, initial encounter: Secondary | ICD-10-CM | POA: Insufficient documentation

## 2016-01-02 DIAGNOSIS — F17210 Nicotine dependence, cigarettes, uncomplicated: Secondary | ICD-10-CM | POA: Insufficient documentation

## 2016-01-02 DIAGNOSIS — Y999 Unspecified external cause status: Secondary | ICD-10-CM | POA: Insufficient documentation

## 2016-01-02 DIAGNOSIS — Y929 Unspecified place or not applicable: Secondary | ICD-10-CM | POA: Insufficient documentation

## 2016-01-02 DIAGNOSIS — Y9389 Activity, other specified: Secondary | ICD-10-CM | POA: Insufficient documentation

## 2016-01-02 DIAGNOSIS — I10 Essential (primary) hypertension: Secondary | ICD-10-CM | POA: Insufficient documentation

## 2016-01-02 DIAGNOSIS — S92354A Nondisplaced fracture of fifth metatarsal bone, right foot, initial encounter for closed fracture: Secondary | ICD-10-CM | POA: Insufficient documentation

## 2016-01-02 DIAGNOSIS — Z792 Long term (current) use of antibiotics: Secondary | ICD-10-CM | POA: Insufficient documentation

## 2016-01-02 DIAGNOSIS — E039 Hypothyroidism, unspecified: Secondary | ICD-10-CM | POA: Insufficient documentation

## 2016-01-02 DIAGNOSIS — Z79899 Other long term (current) drug therapy: Secondary | ICD-10-CM | POA: Insufficient documentation

## 2016-01-02 MED ORDER — HYDROMORPHONE HCL 1 MG/ML IJ SOLN
0.5000 mg | Freq: Once | INTRAMUSCULAR | Status: AC
Start: 2016-01-02 — End: 2016-01-02
  Administered 2016-01-02: 0.5 mg via INTRAMUSCULAR
  Filled 2016-01-02: qty 1

## 2016-01-02 MED ORDER — KETOROLAC TROMETHAMINE 60 MG/2ML IM SOLN
30.0000 mg | Freq: Once | INTRAMUSCULAR | Status: AC
  Administered 2016-01-02: 30 mg via INTRAMUSCULAR
  Filled 2016-01-02: qty 2

## 2016-01-02 MED ORDER — OXYCODONE-ACETAMINOPHEN 5-325 MG PO TABS: 1.0000 | ORAL_TABLET | Freq: Four times a day (QID) | ORAL | 0 refills | Status: DC | PRN

## 2016-01-02 NOTE — ED Provider Notes (Signed)
[facility] Emergency Department Provider Note   ____________________________________________   First MD Initiated Contact with Patient 01/02/16 2315     (approximate)  I have reviewed the triage vital signs and the nursing notes.   HISTORY  Chief Complaint Foot Pain    HPI Diane Glenn is a 65 y.o. female patient complaining of right foot pain secondary to a twisting incident. Patient state she attempts to get out her porch swing and rolls her foot.Patient describes the pain as a burning sensation to the outside of her right foot. Patient states she has a previous injury to the foot and she is afraid she might a reinjured it. Patient rates the pain as 8/10. Patient placed her foot inside of an orthopedic booth from her previous injury.   Past Medical History:  Diagnosis Date  . Hypertension   . Restless leg syndrome   . Thyroid disease     Patient Active Problem List   Diagnosis Date Noted  . Essential hypertension 01/27/2015  . Thyroid activity decreased 01/27/2015  . Restless legs syndrome 01/27/2015  . Degenerative disc disease, cervical 01/27/2015    Past Surgical History:  Procedure Laterality Date  . COLONOSCOPY  2013   cleared for 10 yrs- Dr Vira Agar?    Prior to Admission medications   Medication Sig Start Date End Date Taking? Authorizing Provider  cyclobenzaprine (FLEXERIL) 10 MG tablet TAKE 1 TABLET (10 MG TOTAL) BY MOUTH AT BEDTIME. 07/28/15  Yes Juline Patch, MD  fluticasone (FLONASE) 50 MCG/ACT nasal spray USE 1-2 SPRAYS IN EACH NOSTRIL TWICE DAILY 09/23/15  Yes Juline Patch, MD  levothyroxine (SYNTHROID, LEVOTHROID) 75 MCG tablet TAKE 1 TABLET DAILY.NEEDS APPT 07/28/15  Yes Juline Patch, MD  metoprolol succinate (TOPROL-XL) 100 MG 24 hr tablet Take 1 tablet (100 mg total) by mouth daily. Take with or immediately following a meal. 07/28/15  Yes Juline Patch, MD  pramipexole (MIRAPEX) 0.5 MG tablet TAKE 1 TABLET BY  MOUTH AT BEDTIME , MUST MAKE APPT 07/28/15  Yes Juline Patch, MD  amoxicillin-clavulanate (AUGMENTIN) 875-125 MG tablet Take 1 tablet by mouth 2 (two) times daily. 10/09/15   Juline Patch, MD  guaiFENesin-codeine (ROBITUSSIN AC) 100-10 MG/5ML syrup Take 5 mLs by mouth 3 (three) times daily as needed for cough. 10/09/15   Juline Patch, MD  levofloxacin (LEVAQUIN) 500 MG tablet Take 1 tablet (500 mg total) by mouth daily. 10/15/15   Juline Patch, MD  levothyroxine (SYNTHROID, LEVOTHROID) 75 MCG tablet TAKE 1 TABLET DAILY.NEEDS APPT 12/24/15   Juline Patch, MD  metoprolol succinate (TOPROL-XL) 100 MG 24 hr tablet TAKE 1 TABLET DAILY. PLEASEMAKE AN APPOINTMENT WITH   YOUR DOCTOR IN [place]. 12/24/15   Juline Patch, MD  oxyCODONE-acetaminophen (ROXICET) 5-325 MG tablet Take 1 tablet by mouth every 6 (six) hours as needed. 01/02/16 01/01/17  Sable Feil, PA-C    Allergies Review of patient's allergies indicates no known allergies.  Family History  Problem Relation Age of Onset  . Diabetes Father   . Stroke Father     Social History Social History  Substance Use Topics  . Smoking status: Current Every Day Smoker    Packs/day: 1.00    Types: Cigarettes  . Smokeless tobacco: Never Used  . Alcohol use 0.0 oz/week     Comment: seldom    Review of Systems Constitutional: No fever/chills Eyes: No visual changes. ENT: No sore throat. Cardiovascular: Denies chest pain.  Respiratory: Denies shortness of breath. Gastrointestinal: No abdominal pain.  No nausea, no vomiting.  No diarrhea.  No constipation. Genitourinary: Negative for dysuria. Musculoskeletal: Right lateral foot pain  Skin: Negative for rash. Neurological: Negative for headaches, focal weakness or numbness. Endocrine:Hypertension hypothyroidism  ____________________________________________   PHYSICAL EXAM:  VITAL SIGNS: ED Triage Vitals  Enc Vitals Group     BP 01/02/16 2242 (!) 157/77     Pulse Rate 01/02/16 2242  79     Resp 01/02/16 2242 18     Temp 01/02/16 2242 98.2 F (36.8 C)     Temp Source 01/02/16 2242 Oral     SpO2 01/02/16 2242 99 %     Weight 01/02/16 2243 145 lb (65.8 kg)     Height 01/02/16 2243 5\' 6"  (1.676 m)     Head Circumference --      Peak Flow --      Pain Score 01/02/16 2244 8     Pain Loc --      Pain Edu? --      Excl. in [place]? --     Constitutional: Alert and oriented. Well appearing and in no acute distress. Eyes: Conjunctivae are normal. PERRL. EOMI. Head: Atraumatic. Nose: No congestion/rhinnorhea. Mouth/Throat: Mucous membranes are moist.  Oropharynx non-erythematous. Neck: No stridor.  No cervical spine tenderness to palpation. Hematological/Lymphatic/Immunilogical: No cervical lymphadenopathy. }Cardiovascular: Normal rate, regular rhythm. Grossly normal heart sounds.  Good peripheral circulation. Respiratory: Normal respiratory effort.  No retractions. Lungs CTAB. Gastrointestinal: Soft and nontender. No distention. No abdominal bruits. No CVA tenderness. Musculoskeletal: Obvious deformity to the right foot. Patient has some moderate guarding palpation at the fifth metatarsal head.  Neurologic:  Normal speech and language. No gross focal neurologic deficits are appreciated. No gait instability. Skin:  Skin is warm, dry and intact. No rash noted. Psychiatric: Mood and affect are normal. Speech and behavior are normal.  ____________________________________________   LABS (all labs ordered are listed, but only abnormal results are displayed)  Labs Reviewed - No data to display ____________________________________________  EKG   ____________________________________________  RADIOLOGY  Right fifth metatarsal fracture. ____________________________________________   PROCEDURES  Procedure(s) performed: None  Procedures  Critical Care performed: No  ____________________________________________   INITIAL IMPRESSION / ASSESSMENT AND PLAN / ED  COURSE  Pertinent labs & imaging results that were available during my care of the patient were reviewed by me and considered in my medical decision making (see chart for details).  Right fifth metatarsal fracture. Discussed x-ray finding with patient. Patient given discharge care instructions. Patient advised to follow orthopedics by calling for an appointment in 2 days. Patient given a prescription for Percocets.  Clinical Course     ____________________________________________   FINAL CLINICAL IMPRESSION(S) / ED DIAGNOSES  Final diagnoses:  Metatarsal fracture, right, closed, initial encounter      NEW MEDICATIONS STARTED DURING THIS VISIT:  New Prescriptions   OXYCODONE-ACETAMINOPHEN (ROXICET) 5-325 MG TABLET    Take 1 tablet by mouth every 6 (six) hours as needed.     Note:  This document was prepared using Dragon voice recognition software and may include unintentional dictation errors.    Sable Feil, PA-C 01/02/16 [id number]    Daymon Larsen, MD 01/04/16 [phone]

## 2016-01-02 NOTE — ED Triage Notes (Addendum)
Pt says she twisted her right foot getting out of her porch swing about an hour ago; throbbing and burning pain to outside right foot;  pt already had a boot from previous injury to same and arrived wearing the boot;

## 2016-01-02 NOTE — ED Notes (Signed)
Pt has her own cam boot that she arrived to hospital with. Pt states she also has crutches in car and does not need those either. Cms intact to toes after cam boot applied.

## 2016-01-16 ENCOUNTER — Other Ambulatory Visit: Payer: Self-pay | Admitting: Family Medicine

## 2016-01-28 ENCOUNTER — Other Ambulatory Visit: Payer: Self-pay

## 2016-01-28 MED ORDER — ALENDRONATE SODIUM 70 MG PO TABS: 70.0000 mg | ORAL_TABLET | ORAL | 5 refills | Status: DC

## 2016-02-02 ENCOUNTER — Other Ambulatory Visit: Payer: Self-pay

## 2016-03-09 ENCOUNTER — Ambulatory Visit: Payer: PRIVATE HEALTH INSURANCE | Admitting: Family Medicine

## 2016-03-10 ENCOUNTER — Ambulatory Visit (INDEPENDENT_AMBULATORY_CARE_PROVIDER_SITE_OTHER): Payer: PRIVATE HEALTH INSURANCE | Admitting: Family Medicine

## 2016-03-10 VITALS — BP 138/76 | HR 82 | Temp 97.6°F | Ht 66.0 in | Wt 156.0 lb

## 2016-03-10 DIAGNOSIS — I10 Essential (primary) hypertension: Secondary | ICD-10-CM

## 2016-03-10 DIAGNOSIS — M810 Age-related osteoporosis without current pathological fracture: Secondary | ICD-10-CM | POA: Diagnosis not present

## 2016-03-10 DIAGNOSIS — F17210 Nicotine dependence, cigarettes, uncomplicated: Secondary | ICD-10-CM

## 2016-03-10 DIAGNOSIS — R300 Dysuria: Secondary | ICD-10-CM

## 2016-03-10 DIAGNOSIS — G2581 Restless legs syndrome: Secondary | ICD-10-CM | POA: Diagnosis not present

## 2016-03-10 DIAGNOSIS — E039 Hypothyroidism, unspecified: Secondary | ICD-10-CM | POA: Diagnosis not present

## 2016-03-10 DIAGNOSIS — L739 Follicular disorder, unspecified: Secondary | ICD-10-CM

## 2016-03-10 DIAGNOSIS — M5030 Other cervical disc degeneration, unspecified cervical region: Secondary | ICD-10-CM

## 2016-03-10 LAB — POCT URINALYSIS DIPSTICK
Bilirubin, UA: NEGATIVE
Blood, UA: NEGATIVE
GLUCOSE UA: NEGATIVE
KETONES UA: NEGATIVE
Leukocytes, UA: NEGATIVE
Nitrite, UA: NEGATIVE
Protein, UA: NEGATIVE
SPEC GRAV UA: 1.01
Urobilinogen, UA: 0.2
pH, UA: 5

## 2016-03-10 MED ORDER — METOPROLOL SUCCINATE ER 100 MG PO TB24: 100.0000 mg | ORAL_TABLET | Freq: Every day | ORAL | 1 refills | Status: DC

## 2016-03-10 MED ORDER — PRAMIPEXOLE DIHYDROCHLORIDE 0.5 MG PO TABS: ORAL_TABLET | ORAL | 1 refills | Status: DC

## 2016-03-10 MED ORDER — METOPROLOL SUCCINATE ER 100 MG PO TB24: ORAL_TABLET | ORAL | 1 refills | Status: DC

## 2016-03-10 MED ORDER — CEPHALEXIN 500 MG PO CAPS: 500.0000 mg | ORAL_CAPSULE | Freq: Two times a day (BID) | ORAL | 0 refills | Status: DC

## 2016-03-10 MED ORDER — ALENDRONATE SODIUM 70 MG PO TABS: 70.0000 mg | ORAL_TABLET | ORAL | 5 refills | Status: DC

## 2016-03-10 MED ORDER — VARENICLINE TARTRATE 0.5 MG X 11 & 1 MG X 42 PO MISC: ORAL | 0 refills | Status: DC

## 2016-03-10 MED ORDER — LEVOTHYROXINE SODIUM 75 MCG PO TABS: ORAL_TABLET | ORAL | 1 refills | Status: DC

## 2016-03-10 NOTE — Progress Notes (Signed)
Name: Diane Glenn [facility]   MRN: [id number]    DOB: 11-19-1950   Date:03/10/2016       Progress Note  Subjective  Chief Complaint  Chief Complaint  Patient presents with  . Hypothyroidism    refill    Follow up for osteoporosis and recently started on fosamax.   Thyroid Problem  Presents for follow-up visit. Patient reports no anxiety, cold intolerance, constipation, depressed mood, diaphoresis, diarrhea, dry skin, fatigue, hair loss, heat intolerance, hoarse voice, leg swelling, menstrual problem, nail problem, palpitations, tremors, visual change, weight gain or weight loss. The symptoms have been stable. There is no history of heart failure.  Hypertension  The current episode started more than 1 year ago. The problem has been gradually improving since onset. The problem is controlled. Pertinent negatives include no anxiety, blurred vision, chest pain, headaches, malaise/fatigue, neck pain, orthopnea, palpitations, peripheral edema, PND, shortness of breath or sweats. There are no associated agents to hypertension. There are no known risk factors for coronary artery disease. Past treatments include beta blockers. The current treatment provides mild improvement. There are no compliance problems.  Hypertensive end-organ damage includes a thyroid problem. There is no history of angina, kidney disease, CAD/MI, CVA, heart failure, left ventricular hypertrophy, PVD, renovascular disease or retinopathy. There is no history of chronic renal disease or a hypertension causing med.    No problem-specific Assessment & Plan notes found for this encounter.   Past Medical History:  Diagnosis Date  . Hypertension   . Restless leg syndrome   . Thyroid disease     Past Surgical History:  Procedure Laterality Date  . COLONOSCOPY  2013   cleared for 10 yrs- Dr Vira Agar?    Family History  Problem Relation Age of Onset  . Diabetes Father   . Stroke Father     Social History   Social History   . Marital status: Married    Spouse name: N/A  . Number of children: N/A  . Years of education: N/A   Occupational History  . Not on file.   Social History Main Topics  . Smoking status: Current Every Day Smoker    Packs/day: 1.00    Types: Cigarettes  . Smokeless tobacco: Never Used  . Alcohol use 0.0 oz/week     Comment: seldom  . Drug use: No  . Sexual activity: Yes   Other Topics Concern  . Not on file   Social History Narrative  . No narrative on file    No Known Allergies   Review of Systems  Constitutional: Negative for chills, diaphoresis, fatigue, fever, malaise/fatigue, weight gain and weight loss.  HENT: Negative for ear discharge, ear pain, hoarse voice and sore throat.   Eyes: Negative for blurred vision.  Respiratory: Negative for cough, sputum production, shortness of breath and wheezing.   Cardiovascular: Negative for chest pain, palpitations, orthopnea, leg swelling and PND.  Gastrointestinal: Negative for abdominal pain, blood in stool, constipation, diarrhea, heartburn, melena and nausea.  Genitourinary: Negative for dysuria, frequency, hematuria, menstrual problem and urgency.  Musculoskeletal: Negative for back pain, joint pain, myalgias and neck pain.  Skin: Negative for rash.  Neurological: Negative for dizziness, tingling, tremors, sensory change, focal weakness and headaches.  Endo/Heme/Allergies: Negative for environmental allergies, cold intolerance, heat intolerance and polydipsia. Does not bruise/bleed easily.  Psychiatric/Behavioral: Negative for depression and suicidal ideas. The patient is not nervous/anxious and does not have insomnia.      Objective  Vitals:   03/10/16 [id number]  BP: 138/76  Pulse: 82  Temp: 97.6 F (36.4 C)  SpO2: 97%  Weight: 156 lb (70.8 kg)  Height: 5\' 6"  (1.676 m)    Physical Exam  Constitutional: She is well-developed, well-nourished, and in no distress. No distress.  HENT:  Head: Normocephalic and  atraumatic.  Right Ear: External ear normal.  Left Ear: External ear normal.  Nose: Nose normal.  Mouth/Throat: Oropharynx is clear and moist.  Eyes: Conjunctivae and EOM are normal. Pupils are equal, round, and reactive to light. Right eye exhibits no discharge. Left eye exhibits no discharge.  Neck: Normal range of motion. Neck supple. No JVD present. No thyromegaly present.  Cardiovascular: Normal rate, regular rhythm, normal heart sounds and intact distal pulses.  Exam reveals no gallop and no friction rub.   No murmur heard. Pulmonary/Chest: Effort normal and breath sounds normal. She has no wheezes. She has no rales.  Abdominal: Soft. Bowel sounds are normal. She exhibits no mass. There is no tenderness. There is no guarding.  Musculoskeletal: Normal range of motion. She exhibits no edema.  Lymphadenopathy:    She has no cervical adenopathy.  Neurological: She is alert. She has normal reflexes.  Skin: Skin is warm and dry. No rash noted. She is not diaphoretic. No erythema.  Psychiatric: Mood and affect normal.  Nursing note and vitals reviewed.     Assessment & Plan  Problem List Items Addressed This Visit      Cardiovascular and Mediastinum   Essential hypertension - Primary   Relevant Medications   metoprolol succinate (TOPROL-XL) 100 MG 24 hr tablet   metoprolol succinate (TOPROL-XL) 100 MG 24 hr tablet   Other Relevant Orders   Renal Function Panel     Endocrine   Thyroid activity decreased   Relevant Medications   metoprolol succinate (TOPROL-XL) 100 MG 24 hr tablet   metoprolol succinate (TOPROL-XL) 100 MG 24 hr tablet   levothyroxine (SYNTHROID, LEVOTHROID) 75 MCG tablet   Other Relevant Orders   TSH     Musculoskeletal and Integument   Degenerative disc disease, cervical     Other   Restless legs syndrome   Relevant Medications   pramipexole (MIRAPEX) 0.5 MG tablet    Other Visit Diagnoses    Folliculitis       Relevant Medications   cephALEXin  (KEFLEX) 500 MG capsule   Age related osteoporosis, unspecified pathological fracture presence       Relevant Medications   alendronate (FOSAMAX) 70 MG tablet   Cigarette nicotine dependence without complication       Relevant Medications   varenicline (CHANTIX STARTING MONTH PAK) 0.5 MG X 11 & 1 MG X 42 tablet   Dysuria       Relevant Orders   POCT urinalysis dipstick (Completed)   Urinalysis        Dr. Deanna Jones [facility]  03/10/16

## 2016-03-11 LAB — RENAL FUNCTION PANEL
ALBUMIN: 4.5 g/dL (ref 3.6–4.8)
BUN/Creatinine Ratio: 18 (ref 12–28)
BUN: 14 mg/dL (ref 8–27)
CO2: 26 mmol/L (ref 18–29)
CREATININE: 0.79 mg/dL (ref 0.57–1.00)
Calcium: 10 mg/dL (ref 8.7–10.3)
Chloride: 100 mmol/L (ref 96–106)
GFR, EST AFRICAN AMERICAN: 91 mL/min/{1.73_m2} (ref >59)
GFR, EST NON AFRICAN AMERICAN: 79 mL/min/{1.73_m2} (ref >59)
GLUCOSE: 99 mg/dL (ref 65–99)
Phosphorus: 3.2 mg/dL (ref 2.5–4.5)
Potassium: 5.5 mmol/L — ABNORMAL HIGH (ref 3.5–5.2)
Sodium: 140 mmol/L (ref 134–144)

## 2016-03-11 LAB — SPECIMEN STATUS REPORT

## 2016-03-11 LAB — URINALYSIS

## 2016-03-11 LAB — TSH: TSH: 2.94 u[IU]/mL (ref 0.450–4.500)

## 2016-03-31 ENCOUNTER — Other Ambulatory Visit: Payer: Self-pay

## 2016-04-13 ENCOUNTER — Other Ambulatory Visit: Payer: Self-pay | Admitting: Family Medicine

## 2016-04-27 ENCOUNTER — Telehealth: Payer: Self-pay

## 2016-04-27 NOTE — Telephone Encounter (Signed)
Brother taking Ropinrole for restless leg. Can she try this and can we call it in?

## 2016-04-29 ENCOUNTER — Other Ambulatory Visit: Payer: Self-pay

## 2016-04-29 DIAGNOSIS — G2581 Restless legs syndrome: Secondary | ICD-10-CM

## 2016-04-29 MED ORDER — ROPINIROLE HCL 0.25 MG PO TABS: 0.2500 mg | ORAL_TABLET | Freq: Every day | ORAL | 1 refills | Status: DC

## 2016-04-29 NOTE — Telephone Encounter (Signed)
Detailed msg

## 2016-04-29 NOTE — Telephone Encounter (Signed)
Please tell pt  We switched her to ropinorole- sent in to [facility] [place]- however needs appt for follow up in 6 weeks

## 2016-05-02 ENCOUNTER — Other Ambulatory Visit: Payer: Self-pay | Admitting: Family Medicine

## 2016-05-12 ENCOUNTER — Other Ambulatory Visit: Payer: Self-pay | Admitting: Family Medicine

## 2016-06-06 ENCOUNTER — Other Ambulatory Visit: Payer: Self-pay | Admitting: Family Medicine

## 2016-06-07 ENCOUNTER — Other Ambulatory Visit: Payer: Self-pay | Admitting: Family Medicine

## 2016-06-14 ENCOUNTER — Encounter: Payer: Self-pay | Admitting: Family Medicine

## 2016-06-14 ENCOUNTER — Ambulatory Visit (INDEPENDENT_AMBULATORY_CARE_PROVIDER_SITE_OTHER): Payer: PRIVATE HEALTH INSURANCE | Admitting: Family Medicine

## 2016-06-14 VITALS — BP 120/80 | HR 68 | Ht 66.0 in | Wt 158.0 lb

## 2016-06-14 DIAGNOSIS — I10 Essential (primary) hypertension: Secondary | ICD-10-CM

## 2016-06-14 DIAGNOSIS — G2581 Restless legs syndrome: Secondary | ICD-10-CM

## 2016-06-14 DIAGNOSIS — E039 Hypothyroidism, unspecified: Secondary | ICD-10-CM | POA: Diagnosis not present

## 2016-06-14 MED ORDER — LEVOTHYROXINE SODIUM 75 MCG PO TABS: ORAL_TABLET | ORAL | 0 refills | Status: DC

## 2016-06-14 MED ORDER — LEVOTHYROXINE SODIUM 75 MCG PO TABS: ORAL_TABLET | ORAL | 1 refills | Status: DC

## 2016-06-14 MED ORDER — METOPROLOL SUCCINATE ER 100 MG PO TB24: 100.0000 mg | ORAL_TABLET | Freq: Every day | ORAL | 1 refills | Status: DC

## 2016-06-14 MED ORDER — ROPINIROLE HCL 0.25 MG PO TABS: 0.2500 mg | ORAL_TABLET | Freq: Every day | ORAL | 1 refills | Status: DC

## 2016-06-14 MED ORDER — METOPROLOL SUCCINATE ER 100 MG PO TB24: 100.0000 mg | ORAL_TABLET | Freq: Every day | ORAL | 0 refills | Status: DC

## 2016-06-14 MED ORDER — ROPINIROLE HCL 0.25 MG PO TABS: 0.2500 mg | ORAL_TABLET | Freq: Every day | ORAL | 0 refills | Status: DC

## 2016-06-14 NOTE — Progress Notes (Signed)
Name: Diane Glenn [facility]   MRN: [id number]    DOB: [birth year]   Date:06/14/2016       Progress Note  Subjective  Chief Complaint  Chief Complaint  Patient presents with  . restless leg    changed to requip from mirapex  . Hypothyroidism  . Hypertension    Patient presents for medication refill. Restless leg improved on medication.   Hypertension  This is a chronic problem. The current episode started more than 1 year ago. The problem has been gradually improving since onset. The problem is controlled. Pertinent negatives include no anxiety, blurred vision, chest pain, headaches, malaise/fatigue, neck pain, orthopnea, palpitations, peripheral edema, PND, shortness of breath or sweats. There are no associated agents to hypertension. There are no known risk factors for coronary artery disease. Past treatments include beta blockers. The current treatment provides moderate improvement. There are no compliance problems.  There is no history of angina, kidney disease, CAD/MI, CVA, heart failure, left ventricular hypertrophy, PVD, renovascular disease or retinopathy. Identifiable causes of hypertension include a thyroid problem. There is no history of chronic renal disease or a hypertension causing med.  Thyroid Problem  Presents for follow-up visit. Patient reports no anxiety, cold intolerance, constipation, depressed mood, diaphoresis, diarrhea, dry skin, fatigue, hair loss, heat intolerance, hoarse voice, leg swelling, menstrual problem, palpitations, tremors, visual change, weight gain or weight loss. The symptoms have been stable. There is no history of heart failure.    No problem-specific Assessment & Plan notes found for this encounter.   Past Medical History:  Diagnosis Date  . Hypertension   . Restless leg syndrome   . Thyroid disease     Past Surgical History:  Procedure Laterality Date  . COLONOSCOPY  2013   cleared for 10 yrs- Dr Vira Agar?    Family History  Problem  Relation Age of Onset  . Diabetes Father   . Stroke Father     Social History   Social History  . Marital status: Married    Spouse name: N/A  . Number of children: N/A  . Years of education: N/A   Occupational History  . Not on file.   Social History Main Topics  . Smoking status: Current Every Day Smoker    Packs/day: 1.00    Types: Cigarettes  . Smokeless tobacco: Never Used  . Alcohol use 0.0 oz/week     Comment: seldom  . Drug use: No  . Sexual activity: Yes   Other Topics Concern  . Not on file   Social History Narrative  . No narrative on file    No Known Allergies  Outpatient Medications Prior to Visit  Medication Sig Dispense Refill  . alendronate (FOSAMAX) 70 MG tablet Take 1 tablet (70 mg total) by mouth every 7 (seven) days. Take with a full glass of water on an empty stomach. 4 tablet 5  . CHANTIX CONTINUING MONTH PAK 1 MG tablet TAKE AS DIRECTED 56 tablet 0  . cyclobenzaprine (FLEXERIL) 10 MG tablet TAKE 1 TABLET (10 MG TOTAL) BY MOUTH AT BEDTIME. 30 tablet 5  . fluticasone (FLONASE) 50 MCG/ACT nasal spray INSTILL 1-2 SPRAYS INTO EACH NOSTRIL TWICE DAILY 16 g 1  . levothyroxine (SYNTHROID, LEVOTHROID) 75 MCG tablet TAKE 1 TABLET DAILY.NEEDS APPT 90 tablet 0  . metoprolol succinate (TOPROL-XL) 100 MG 24 hr tablet Take 1 tablet (100 mg total) by mouth daily. Take with or immediately following a meal. 90 tablet 1  . rOPINIRole (REQUIP) 0.25 MG tablet Take  1 tablet (0.25 mg total) by mouth at bedtime. 30 tablet 1  . amoxicillin-clavulanate (AUGMENTIN) 875-125 MG tablet Take 1 tablet by mouth 2 (two) times daily. (Patient not taking: Reported on 03/10/2016) 20 tablet 0  . cephALEXin (KEFLEX) 500 MG capsule Take 1 capsule (500 mg total) by mouth 2 (two) times daily. 10 capsule 0  . guaiFENesin-codeine (ROBITUSSIN AC) 100-10 MG/5ML syrup Take 5 mLs by mouth 3 (three) times daily as needed for cough. 150 mL 0  . levofloxacin (LEVAQUIN) 500 MG tablet Take 1 tablet  (500 mg total) by mouth daily. 7 tablet 0  . levothyroxine (SYNTHROID, LEVOTHROID) 75 MCG tablet TAKE 1 TABLET DAILY.NEEDS APPT 90 tablet 1  . metoprolol succinate (TOPROL-XL) 100 MG 24 hr tablet TAKE 1 TABLET DAILY. PLEASEMAKE AN APPOINTMENT WITH   YOUR DOCTOR IN [facility]. 90 tablet 1  . metoprolol succinate (TOPROL-XL) 100 MG 24 hr tablet TAKE 1 TABLET DAILY. PLEASEMAKE AN APPOINTMENT WITH   YOUR DOCTOR IN [facility]. 90 tablet 0  . oxyCODONE-acetaminophen (ROXICET) 5-325 MG tablet Take 1 tablet by mouth every 6 (six) hours as needed. 20 tablet 0   No facility-administered medications prior to visit.     Review of Systems  Constitutional: Negative for chills, diaphoresis, fatigue, fever, malaise/fatigue, weight gain and weight loss.  HENT: Negative for ear discharge, ear pain, hoarse voice and sore throat.   Eyes: Negative for blurred vision.  Respiratory: Negative for cough, sputum production, shortness of breath and wheezing.   Cardiovascular: Negative for chest pain, palpitations, orthopnea, leg swelling and PND.  Gastrointestinal: Negative for abdominal pain, blood in stool, constipation, diarrhea, heartburn, melena and nausea.  Genitourinary: Negative for dysuria, frequency, hematuria, menstrual problem and urgency.  Musculoskeletal: Negative for back pain, joint pain, myalgias and neck pain.  Skin: Negative for rash.  Neurological: Negative for dizziness, tingling, tremors, sensory change, focal weakness and headaches.  Endo/Heme/Allergies: Negative for environmental allergies, cold intolerance, heat intolerance and polydipsia. Does not bruise/bleed easily.  Psychiatric/Behavioral: Negative for depression and suicidal ideas. The patient is not nervous/anxious and does not have insomnia.      Objective  Vitals:   06/14/16 1437  BP: 120/80  Pulse: 68  Weight: 158 lb (71.7 kg)  Height: 5\' 6"  (1.676 m)    Physical Exam  Constitutional: She is well-developed, well-nourished, and in no  distress. No distress.  HENT:  Head: Normocephalic and atraumatic.  Right Ear: External ear normal.  Left Ear: External ear normal.  Nose: Nose normal.  Mouth/Throat: Oropharynx is clear and moist.  Eyes: Conjunctivae and EOM are normal. Pupils are equal, round, and reactive to light. Right eye exhibits no discharge. Left eye exhibits no discharge.  Neck: Normal range of motion. Neck supple. No JVD present. No thyromegaly present.  Cardiovascular: Normal rate, regular rhythm, normal heart sounds and intact distal pulses.  Exam reveals no gallop and no friction rub.   No murmur heard. Pulmonary/Chest: Effort normal and breath sounds normal. She has no wheezes. She has no rales.  Abdominal: Soft. Bowel sounds are normal. She exhibits no mass. There is no tenderness. There is no guarding.  Musculoskeletal: Normal range of motion. She exhibits no edema.  Lymphadenopathy:    She has no cervical adenopathy.  Neurological: She is alert. She has normal reflexes.  Skin: Skin is warm and dry. She is not diaphoretic.  Psychiatric: Mood and affect normal.  Nursing note and vitals reviewed.     Assessment & Plan  Problem List Items Addressed  This Visit      Cardiovascular and Mediastinum   Essential hypertension   Relevant Medications   metoprolol succinate (TOPROL-XL) 100 MG 24 hr tablet     Endocrine   Thyroid activity decreased - Primary   Relevant Medications   levothyroxine (SYNTHROID, LEVOTHROID) 75 MCG tablet   metoprolol succinate (TOPROL-XL) 100 MG 24 hr tablet    Other Visit Diagnoses    Restless leg syndrome       Relevant Medications   rOPINIRole (REQUIP) 0.25 MG tablet      Meds ordered this encounter  Medications  . DISCONTD: levothyroxine (SYNTHROID, LEVOTHROID) 75 MCG tablet    Sig: TAKE 1 TABLET DAILY.NEEDS APPT    Dispense:  90 tablet    Refill:  0  . DISCONTD: metoprolol succinate (TOPROL-XL) 100 MG 24 hr tablet    Sig: Take 1 tablet (100 mg total) by mouth  daily. Take with or immediately following a meal.    Dispense:  90 tablet    Refill:  0  . DISCONTD: rOPINIRole (REQUIP) 0.25 MG tablet    Sig: Take 1 tablet (0.25 mg total) by mouth at bedtime.    Dispense:  90 tablet    Refill:  0  . levothyroxine (SYNTHROID, LEVOTHROID) 75 MCG tablet    Sig: TAKE 1 TABLET DAILY.NEEDS APPT    Dispense:  90 tablet    Refill:  1  . rOPINIRole (REQUIP) 0.25 MG tablet    Sig: Take 1 tablet (0.25 mg total) by mouth at bedtime.    Dispense:  90 tablet    Refill:  1  . metoprolol succinate (TOPROL-XL) 100 MG 24 hr tablet    Sig: Take 1 tablet (100 mg total) by mouth daily. Take with or immediately following a meal.    Dispense:  90 tablet    Refill:  1      Dr. Otilio Miu [facility]  06/14/16

## 2016-06-30 ENCOUNTER — Other Ambulatory Visit: Payer: Self-pay | Admitting: Family Medicine

## 2016-07-14 ENCOUNTER — Other Ambulatory Visit: Payer: Self-pay | Admitting: Family Medicine

## 2016-07-22 ENCOUNTER — Other Ambulatory Visit: Payer: Self-pay | Admitting: Family Medicine

## 2016-08-04 ENCOUNTER — Other Ambulatory Visit: Payer: Self-pay | Admitting: Family Medicine

## 2016-08-25 ENCOUNTER — Other Ambulatory Visit: Payer: Self-pay | Admitting: Family Medicine

## 2016-08-25 DIAGNOSIS — E039 Hypothyroidism, unspecified: Secondary | ICD-10-CM

## 2016-09-08 ENCOUNTER — Ambulatory Visit: Payer: PRIVATE HEALTH INSURANCE | Admitting: Family Medicine

## 2016-09-16 ENCOUNTER — Ambulatory Visit (INDEPENDENT_AMBULATORY_CARE_PROVIDER_SITE_OTHER): Payer: PRIVATE HEALTH INSURANCE | Admitting: Family Medicine

## 2016-09-16 ENCOUNTER — Encounter: Payer: Self-pay | Admitting: Family Medicine

## 2016-09-16 VITALS — BP 122/70 | HR 88 | Ht 66.0 in | Wt 156.0 lb

## 2016-09-16 DIAGNOSIS — I10 Essential (primary) hypertension: Secondary | ICD-10-CM | POA: Diagnosis not present

## 2016-09-16 DIAGNOSIS — G2581 Restless legs syndrome: Secondary | ICD-10-CM | POA: Diagnosis not present

## 2016-09-16 DIAGNOSIS — E785 Hyperlipidemia, unspecified: Secondary | ICD-10-CM | POA: Diagnosis not present

## 2016-09-16 DIAGNOSIS — Z23 Encounter for immunization: Secondary | ICD-10-CM | POA: Diagnosis not present

## 2016-09-16 DIAGNOSIS — Z114 Encounter for screening for human immunodeficiency virus [HIV]: Secondary | ICD-10-CM

## 2016-09-16 DIAGNOSIS — Z1159 Encounter for screening for other viral diseases: Secondary | ICD-10-CM | POA: Diagnosis not present

## 2016-09-16 DIAGNOSIS — M810 Age-related osteoporosis without current pathological fracture: Secondary | ICD-10-CM

## 2016-09-16 DIAGNOSIS — E039 Hypothyroidism, unspecified: Secondary | ICD-10-CM

## 2016-09-16 DIAGNOSIS — M5030 Other cervical disc degeneration, unspecified cervical region: Secondary | ICD-10-CM

## 2016-09-16 DIAGNOSIS — W5503XA Scratched by cat, initial encounter: Secondary | ICD-10-CM

## 2016-09-16 MED ORDER — ALENDRONATE SODIUM 70 MG PO TABS: 70.0000 mg | ORAL_TABLET | ORAL | 5 refills | Status: DC

## 2016-09-16 MED ORDER — METOPROLOL SUCCINATE ER 100 MG PO TB24: 100.0000 mg | ORAL_TABLET | Freq: Every day | ORAL | 1 refills | Status: DC

## 2016-09-16 MED ORDER — ROPINIROLE HCL 0.25 MG PO TABS: 0.2500 mg | ORAL_TABLET | Freq: Every day | ORAL | 1 refills | Status: DC

## 2016-09-16 MED ORDER — LEVOTHYROXINE SODIUM 75 MCG PO TABS: ORAL_TABLET | ORAL | 1 refills | Status: DC

## 2016-09-16 MED ORDER — CYCLOBENZAPRINE HCL 10 MG PO TABS: ORAL_TABLET | ORAL | 5 refills | Status: DC

## 2016-09-16 NOTE — Progress Notes (Signed)
Name: Diane Glenn [facility]   MRN: [id number]    DOB: 09-Jan-1958   Date:09/16/2016       Progress Note  Subjective  Chief Complaint  Chief Complaint  Patient presents with  . Osteoporosis  . Hypothyroidism  . Hypertension  . restless leg    Patient has history of osteoporosis and restless leg.   Hypertension  This is a chronic problem. The current episode started more than 1 year ago. The problem is unchanged. The problem is controlled. Pertinent negatives include no anxiety, blurred vision, chest pain, headaches, malaise/fatigue, neck pain, orthopnea, palpitations, peripheral edema, PND, shortness of breath or sweats. There are no associated agents to hypertension. There are no known risk factors for coronary artery disease. Past treatments include beta blockers. The current treatment provides moderate improvement. There are no compliance problems.  There is no history of angina, kidney disease, CAD/MI, CVA, heart failure, left ventricular hypertrophy, PVD or retinopathy. Identifiable causes of hypertension include a thyroid problem. There is no history of chronic renal disease, coarctation of the aorta, hyperaldosteronism, hypercortisolism, a hypertension causing med or renovascular disease.  Thyroid Problem  Presents for follow-up visit. Patient reports no anxiety, cold intolerance, constipation, depressed mood, diaphoresis, diarrhea, dry skin, fatigue, hair loss, heat intolerance, hoarse voice, leg swelling, menstrual problem, nail problem, palpitations, tremors, visual change, weight gain or weight loss. The symptoms have been stable. There is no history of heart failure.    No problem-specific Assessment & Plan notes found for this encounter.   Past Medical History:  Diagnosis Date  . Hypertension   . Restless leg syndrome   . Thyroid disease     Past Surgical History:  Procedure Laterality Date  . COLONOSCOPY  2013   cleared for 10 yrs- Dr Vira Agar?    Family History   Problem Relation Age of Onset  . Diabetes Father   . Stroke Father     Social History   Social History  . Marital status: Married    Spouse name: N/A  . Number of children: N/A  . Years of education: N/A   Occupational History  . Not on file.   Social History Main Topics  . Smoking status: Current Every Day Smoker    Packs/day: 1.00    Types: Cigarettes  . Smokeless tobacco: Never Used  . Alcohol use 0.0 oz/week     Comment: seldom  . Drug use: No  . Sexual activity: Yes   Other Topics Concern  . Not on file   Social History Narrative  . No narrative on file    No Known Allergies  Outpatient Medications Prior to Visit  Medication Sig Dispense Refill  . CALCIUM/MAGNESIUM/ZINC FORMULA PO Take 1 tablet by mouth daily.    . Cholecalciferol (VITAMIN D-3) 5000 units TABS Take 1 tablet by mouth daily.    . fluticasone (FLONASE) 50 MCG/ACT nasal spray INSTILL 1-2 SPRAYS INTO EACH NOSTRIL TWICE DAILY 16 g 2  . vitamin B-12 (CYANOCOBALAMIN) 1000 MCG tablet Take 1,000 mcg by mouth daily.    Marland Kitchen alendronate (FOSAMAX) 70 MG tablet Take 1 tablet (70 mg total) by mouth every 7 (seven) days. Take with a full glass of water on an empty stomach. 4 tablet 5  . cyclobenzaprine (FLEXERIL) 10 MG tablet TAKE 1 TABLET (10 MG TOTAL) BY MOUTH AT BEDTIME. 30 tablet 5  . levothyroxine (SYNTHROID, LEVOTHROID) 75 MCG tablet TAKE 1 TABLET DAILY.NEEDS APPT 90 tablet 1  . metoprolol succinate (TOPROL-XL) 100 MG 24 hr tablet  Take 1 tablet (100 mg total) by mouth daily. Take with or immediately following a meal. 90 tablet 1  . rOPINIRole (REQUIP) 0.25 MG tablet Take 1 tablet (0.25 mg total) by mouth at bedtime. 90 tablet 1  . CHANTIX CONTINUING MONTH PAK 1 MG tablet TAKE AS DIRECTED 56 tablet 0  . levothyroxine (SYNTHROID, LEVOTHROID) 75 MCG tablet TAKE 1 TABLET DAILY.NEEDS APPT 90 tablet 0  . metoprolol succinate (TOPROL-XL) 100 MG 24 hr tablet TAKE 1 TABLET DAILY. PLEASEMAKE AN APPOINTMENT WITH   YOUR  DOCTOR IN [facility]. 90 tablet 0   No facility-administered medications prior to visit.     Review of Systems  Constitutional: Negative for chills, diaphoresis, fatigue, fever, malaise/fatigue, weight gain and weight loss.  HENT: Negative for ear discharge, ear pain, hoarse voice and sore throat.   Eyes: Negative for blurred vision.  Respiratory: Negative for cough, sputum production, shortness of breath and wheezing.   Cardiovascular: Negative for chest pain, palpitations, orthopnea, leg swelling and PND.  Gastrointestinal: Negative for abdominal pain, blood in stool, constipation, diarrhea, heartburn, melena and nausea.  Genitourinary: Negative for dysuria, frequency, hematuria, menstrual problem and urgency.  Musculoskeletal: Negative for back pain, joint pain, myalgias and neck pain.  Skin: Negative for rash.  Neurological: Negative for dizziness, tingling, tremors, sensory change, focal weakness and headaches.  Endo/Heme/Allergies: Negative for environmental allergies, cold intolerance, heat intolerance and polydipsia. Does not bruise/bleed easily.  Psychiatric/Behavioral: Negative for depression and suicidal ideas. The patient is not nervous/anxious and does not have insomnia.      Objective  Vitals:   09/16/16 1556  BP: 122/70  Pulse: 88  Weight: 156 lb (70.8 kg)  Height: 5\' 6"  (1.676 m)    Physical Exam  Constitutional: She is well-developed, well-nourished, and in no distress. No distress.  HENT:  Head: Normocephalic and atraumatic.  Right Ear: External ear normal.  Left Ear: External ear normal.  Nose: Nose normal.  Mouth/Throat: Oropharynx is clear and moist.  Eyes: Conjunctivae and EOM are normal. Pupils are equal, round, and reactive to light. Right eye exhibits no discharge. Left eye exhibits no discharge.  Neck: Normal range of motion. Neck supple. No JVD present. No thyroid mass and no thyromegaly present.  Cardiovascular: Normal rate, regular rhythm, normal heart  sounds and intact distal pulses.  Exam reveals no gallop and no friction rub.   No murmur heard. Pulmonary/Chest: Effort normal and breath sounds normal. She has no wheezes. She has no rales.  Abdominal: Soft. Bowel sounds are normal. She exhibits no mass. There is no tenderness. There is no guarding.  Musculoskeletal: Normal range of motion. She exhibits no edema.  Lymphadenopathy:    She has no cervical adenopathy.  Neurological: She is alert. She has normal reflexes.  Skin: Skin is warm and dry. She is not diaphoretic.  Psychiatric: Mood and affect normal.  Nursing note and vitals reviewed.     Assessment & Plan  Problem List Items Addressed This Visit      Cardiovascular and Mediastinum   Essential hypertension   Relevant Medications   metoprolol succinate (TOPROL-XL) 100 MG 24 hr tablet   Other Relevant Orders   Renal function panel     Endocrine   Thyroid activity decreased   Relevant Medications   metoprolol succinate (TOPROL-XL) 100 MG 24 hr tablet   levothyroxine (SYNTHROID, LEVOTHROID) 75 MCG tablet   Other Relevant Orders   TSH     Musculoskeletal and Integument   Degenerative disc disease, cervical  Relevant Medications   cyclobenzaprine (FLEXERIL) 10 MG tablet    Other Visit Diagnoses    Hyperlipidemia, unspecified hyperlipidemia type    -  Primary   Relevant Medications   metoprolol succinate (TOPROL-XL) 100 MG 24 hr tablet   Other Relevant Orders   Lipid Profile   Age related osteoporosis, unspecified pathological fracture presence       Relevant Medications   alendronate (FOSAMAX) 70 MG tablet   Restless leg syndrome       Relevant Medications   rOPINIRole (REQUIP) 0.25 MG tablet   Need for hepatitis C screening test       Encounter for screening for HIV       Cat scratch       Relevant Orders   Tdap vaccine greater than or equal to 7yo IM (Completed)   Need for vaccination against Streptococcus pneumoniae using pneumococcal conjugate vaccine  13       Relevant Orders   Pneumococcal conjugate vaccine 13-valent IM (Completed)      Meds ordered this encounter  Medications  . alendronate (FOSAMAX) 70 MG tablet    Sig: Take 1 tablet (70 mg total) by mouth every 7 (seven) days. Take with a full glass of water on an empty stomach.    Dispense:  4 tablet    Refill:  5  . cyclobenzaprine (FLEXERIL) 10 MG tablet    Sig: TAKE 1 TABLET (10 MG TOTAL) BY MOUTH AT BEDTIME.    Dispense:  30 tablet    Refill:  5  . metoprolol succinate (TOPROL-XL) 100 MG 24 hr tablet    Sig: Take 1 tablet (100 mg total) by mouth daily. Take with or immediately following a meal.    Dispense:  90 tablet    Refill:  1  . levothyroxine (SYNTHROID, LEVOTHROID) 75 MCG tablet    Sig: TAKE 1 TABLET DAILY.NEEDS APPT    Dispense:  90 tablet    Refill:  1  . rOPINIRole (REQUIP) 0.25 MG tablet    Sig: Take 1 tablet (0.25 mg total) by mouth at bedtime.    Dispense:  90 tablet    Refill:  1      Dr. Otilio Miu [facility] [place]  09/16/16

## 2016-09-17 LAB — LIPID PANEL
CHOL/HDL RATIO: 4.5 ratio — AB (ref 0.0–4.4)
Cholesterol, Total: 274 mg/dL — ABNORMAL HIGH (ref 100–199)
HDL: 61 mg/dL (ref >39)
LDL Calculated: 156 mg/dL — ABNORMAL HIGH (ref 0–99)
TRIGLYCERIDES: 285 mg/dL — AB (ref 0–149)
VLDL CHOLESTEROL CAL: 57 mg/dL — AB (ref 5–40)

## 2016-09-17 LAB — RENAL FUNCTION PANEL
ALBUMIN: 4.7 g/dL (ref 3.6–4.8)
BUN/Creatinine Ratio: 17 (ref 12–28)
BUN: 17 mg/dL (ref 8–27)
CHLORIDE: 100 mmol/L (ref 96–106)
CO2: 24 mmol/L (ref 20–29)
Calcium: 9.9 mg/dL (ref 8.7–10.3)
Creatinine, Ser: 1.01 mg/dL — ABNORMAL HIGH (ref 0.57–1.00)
GFR calc Af Amer: 68 mL/min/{1.73_m2} (ref >59)
GFR, EST NON AFRICAN AMERICAN: 59 mL/min/{1.73_m2} — AB (ref >59)
Glucose: 96 mg/dL (ref 65–99)
PHOSPHORUS: 3.5 mg/dL (ref 2.5–4.5)
POTASSIUM: 5.1 mmol/L (ref 3.5–5.2)
Sodium: 139 mmol/L (ref 134–144)

## 2016-09-17 LAB — TSH: TSH: 1.37 u[IU]/mL (ref 0.450–4.500)

## 2016-09-23 ENCOUNTER — Encounter: Payer: Self-pay | Admitting: Family Medicine

## 2016-09-23 ENCOUNTER — Ambulatory Visit (INDEPENDENT_AMBULATORY_CARE_PROVIDER_SITE_OTHER): Payer: PRIVATE HEALTH INSURANCE | Admitting: Family Medicine

## 2016-09-23 VITALS — BP 130/70 | HR 64 | Ht 66.0 in | Wt 156.0 lb

## 2016-09-23 DIAGNOSIS — Z1239 Encounter for other screening for malignant neoplasm of breast: Secondary | ICD-10-CM

## 2016-09-23 DIAGNOSIS — Z01419 Encounter for gynecological examination (general) (routine) without abnormal findings: Secondary | ICD-10-CM

## 2016-09-23 DIAGNOSIS — Z1211 Encounter for screening for malignant neoplasm of colon: Secondary | ICD-10-CM

## 2016-09-23 DIAGNOSIS — Z1231 Encounter for screening mammogram for malignant neoplasm of breast: Secondary | ICD-10-CM

## 2016-09-23 DIAGNOSIS — Z124 Encounter for screening for malignant neoplasm of cervix: Secondary | ICD-10-CM

## 2016-09-23 DIAGNOSIS — B353 Tinea pedis: Secondary | ICD-10-CM | POA: Diagnosis not present

## 2016-09-23 LAB — HEMOCCULT GUIAC POC 1CARD (OFFICE): Fecal Occult Blood, POC: NEGATIVE

## 2016-09-23 NOTE — Progress Notes (Signed)
Name: Donnae Michels [facility]   MRN: [id number]    DOB: [birth year]   Date:09/23/2016       Progress Note  Subjective  Chief Complaint  Chief Complaint  Patient presents with  . Annual Exam    with pap, mammo    Patient presents for gyn pap and pelvic.     No problem-specific Assessment & Plan notes found for this encounter.   Past Medical History:  Diagnosis Date  . Hypertension   . Restless leg syndrome   . Thyroid disease     Past Surgical History:  Procedure Laterality Date  . COLONOSCOPY  2013   cleared for 10 yrs- Dr Vira Agar?    Family History  Problem Relation Age of Onset  . Diabetes Father   . Stroke Father     Social History   Social History  . Marital status: Married    Spouse name: N/A  . Number of children: N/A  . Years of education: N/A   Occupational History  . Not on file.   Social History Main Topics  . Smoking status: Current Every Day Smoker    Packs/day: 1.00    Types: Cigarettes  . Smokeless tobacco: Never Used  . Alcohol use 0.0 oz/week     Comment: seldom  . Drug use: No  . Sexual activity: Yes   Other Topics Concern  . Not on file   Social History Narrative  . No narrative on file    No Known Allergies  Outpatient Medications Prior to Visit  Medication Sig Dispense Refill  . alendronate (FOSAMAX) 70 MG tablet Take 1 tablet (70 mg total) by mouth every 7 (seven) days. Take with a full glass of water on an empty stomach. 4 tablet 5  . CALCIUM/MAGNESIUM/ZINC FORMULA PO Take 1 tablet by mouth daily.    . Cholecalciferol (VITAMIN D-3) 5000 units TABS Take 1 tablet by mouth daily.    . cyclobenzaprine (FLEXERIL) 10 MG tablet TAKE 1 TABLET (10 MG TOTAL) BY MOUTH AT BEDTIME. 30 tablet 5  . fluticasone (FLONASE) 50 MCG/ACT nasal spray INSTILL 1-2 SPRAYS INTO EACH NOSTRIL TWICE DAILY 16 g 2  . gabapentin (NEURONTIN) 300 MG capsule Take 1 capsule by mouth 3 (three) times daily. Dr Lisette Abu    . levothyroxine (SYNTHROID, LEVOTHROID) 75  MCG tablet TAKE 1 TABLET DAILY.NEEDS APPT 90 tablet 1  . metoprolol succinate (TOPROL-XL) 100 MG 24 hr tablet Take 1 tablet (100 mg total) by mouth daily. Take with or immediately following a meal. 90 tablet 1  . rOPINIRole (REQUIP) 0.25 MG tablet Take 1 tablet (0.25 mg total) by mouth at bedtime. 90 tablet 1  . vitamin B-12 (CYANOCOBALAMIN) 1000 MCG tablet Take 1,000 mcg by mouth daily.     No facility-administered medications prior to visit.     Review of Systems  Constitutional: Negative for chills, fever, malaise/fatigue and weight loss.  HENT: Negative for ear discharge, ear pain and sore throat.   Eyes: Negative for blurred vision.  Respiratory: Negative for cough, sputum production, shortness of breath and wheezing.   Cardiovascular: Negative for chest pain, palpitations and leg swelling.  Gastrointestinal: Negative for abdominal pain, blood in stool, constipation, diarrhea, heartburn, melena and nausea.  Genitourinary: Negative for dysuria, frequency, hematuria and urgency.  Musculoskeletal: Negative for back pain, joint pain, myalgias and neck pain.  Skin: Negative for rash.  Neurological: Negative for dizziness, tingling, sensory change, focal weakness and headaches.  Endo/Heme/Allergies: Negative for environmental allergies and polydipsia. Does not  bruise/bleed easily.  Psychiatric/Behavioral: Negative for depression and suicidal ideas. The patient is not nervous/anxious and does not have insomnia.      Objective  Vitals:   09/23/16 0940  BP: 130/70  Pulse: 64  Weight: 156 lb (70.8 kg)  Height: 5\' 6"  (1.676 m)    Physical Exam  Constitutional: She is well-developed, well-nourished, and in no distress. No distress.  HENT:  Head: Normocephalic and atraumatic.  Right Ear: External ear normal.  Left Ear: External ear normal.  Nose: Nose normal.  Mouth/Throat: Oropharynx is clear and moist.  Eyes: Conjunctivae and EOM are normal. Pupils are equal, round, and reactive  to light. Right eye exhibits no discharge. Left eye exhibits no discharge.  Neck: Normal range of motion. Neck supple. No JVD present. No thyromegaly present.  Cardiovascular: Normal rate, regular rhythm, normal heart sounds and intact distal pulses.  Exam reveals no gallop and no friction rub.   No murmur heard. Pulmonary/Chest: Effort normal and breath sounds normal. She has no wheezes. She has no rales.  Abdominal: Soft. Bowel sounds are normal. She exhibits no mass. There is no tenderness. There is no guarding.  Genitourinary: Vagina normal, uterus normal, cervix normal, right adnexa normal and left adnexa normal. Rectal exam shows guaiac negative stool.  Musculoskeletal: Normal range of motion. She exhibits no edema.  Lymphadenopathy:    She has no cervical adenopathy.  Neurological: She is alert. She has normal reflexes.  Skin: Skin is warm and dry. Laceration noted. She is not diaphoretic. There is erythema.  "cracked skin"  Psychiatric: Mood and affect normal.  Nursing note and vitals reviewed.     Assessment & Plan  Problem List Items Addressed This Visit    None    Visit Diagnoses    Encounter for gynecological examination without abnormal finding    -  Primary   Relevant Orders   Pap IG and HPV (high risk) DNA detection   Tinea pedis of both feet       Relevant Orders   Ambulatory referral to Dermatology   Breast cancer screening       Relevant Orders   MM Digital Screening   Cervical cancer screening       Relevant Orders   Pap IG and HPV (high risk) DNA detection   Colon cancer screening       Relevant Orders   POCT occult blood stool (Completed)      No orders of the defined types were placed in this encounter.     Dr. [facility]  09/23/16

## 2016-09-27 LAB — PAP IG AND HPV HIGH-RISK
HPV, high-risk: NEGATIVE
PAP Smear Comment: 0

## 2016-09-29 ENCOUNTER — Ambulatory Visit
Admission: RE | Admit: 2016-09-29 | Discharge: 2016-09-29 | Disposition: A | Discharge status: Home / Self Care | Payer: 59 | Source: Ambulatory Visit | Attending: Family Medicine | Admitting: Family Medicine

## 2016-09-29 DIAGNOSIS — R768 Other specified abnormal immunological findings in serum: Secondary | ICD-10-CM | POA: Insufficient documentation

## 2016-09-29 DIAGNOSIS — Z1239 Encounter for other screening for malignant neoplasm of breast: Secondary | ICD-10-CM

## 2016-09-29 DIAGNOSIS — Z1231 Encounter for screening mammogram for malignant neoplasm of breast: Secondary | ICD-10-CM | POA: Diagnosis present

## 2016-09-29 DIAGNOSIS — N6489 Other specified disorders of breast: Secondary | ICD-10-CM | POA: Diagnosis not present

## 2016-09-29 DIAGNOSIS — M79671 Pain in right foot: Secondary | ICD-10-CM | POA: Diagnosis not present

## 2016-09-29 DIAGNOSIS — R760 Raised antibody titer: Secondary | ICD-10-CM | POA: Diagnosis not present

## 2016-09-29 DIAGNOSIS — M79672 Pain in left foot: Secondary | ICD-10-CM | POA: Diagnosis not present

## 2016-09-29 DIAGNOSIS — M2550 Pain in unspecified joint: Secondary | ICD-10-CM | POA: Insufficient documentation

## 2016-09-30 ENCOUNTER — Other Ambulatory Visit: Payer: Self-pay | Admitting: Family Medicine

## 2016-09-30 DIAGNOSIS — R928 Other abnormal and inconclusive findings on diagnostic imaging of breast: Secondary | ICD-10-CM

## 2016-09-30 DIAGNOSIS — N6489 Other specified disorders of breast: Secondary | ICD-10-CM

## 2016-10-06 ENCOUNTER — Other Ambulatory Visit: Payer: Self-pay | Admitting: Family Medicine

## 2016-10-19 ENCOUNTER — Ambulatory Visit
Admission: RE | Admit: 2016-10-19 | Discharge: 2016-10-19 | Disposition: A | Discharge status: Home / Self Care | Payer: 59 | Source: Ambulatory Visit | Attending: Family Medicine | Admitting: Family Medicine

## 2016-10-19 DIAGNOSIS — R928 Other abnormal and inconclusive findings on diagnostic imaging of breast: Secondary | ICD-10-CM | POA: Diagnosis present

## 2016-10-19 DIAGNOSIS — N6489 Other specified disorders of breast: Secondary | ICD-10-CM | POA: Insufficient documentation

## 2016-10-22 ENCOUNTER — Other Ambulatory Visit: Payer: Self-pay | Admitting: Family Medicine

## 2016-11-01 ENCOUNTER — Other Ambulatory Visit: Payer: Self-pay | Admitting: Family Medicine

## 2016-11-09 ENCOUNTER — Other Ambulatory Visit: Payer: Self-pay

## 2016-11-10 ENCOUNTER — Other Ambulatory Visit: Payer: Self-pay | Admitting: Family Medicine

## 2016-11-15 ENCOUNTER — Other Ambulatory Visit: Payer: Self-pay | Admitting: Family Medicine

## 2016-12-14 ENCOUNTER — Other Ambulatory Visit: Payer: Self-pay | Admitting: Family Medicine

## 2016-12-15 ENCOUNTER — Other Ambulatory Visit: Payer: Self-pay

## 2016-12-15 DIAGNOSIS — B001 Herpesviral vesicular dermatitis: Secondary | ICD-10-CM

## 2016-12-15 MED ORDER — VALACYCLOVIR HCL 500 MG PO TABS: 500.0000 mg | ORAL_TABLET | Freq: Two times a day (BID) | ORAL | 1 refills | Status: DC

## 2016-12-15 MED ORDER — TRIAMCINOLONE ACETONIDE 0.1 % EX CREA: 1.0000 "application " | TOPICAL_CREAM | Freq: Two times a day (BID) | CUTANEOUS | 0 refills | Status: DC

## 2016-12-19 ENCOUNTER — Other Ambulatory Visit: Payer: Self-pay | Admitting: Family Medicine

## 2016-12-19 ENCOUNTER — Ambulatory Visit (INDEPENDENT_AMBULATORY_CARE_PROVIDER_SITE_OTHER): Payer: PRIVATE HEALTH INSURANCE | Admitting: Family Medicine

## 2016-12-19 ENCOUNTER — Encounter: Payer: Self-pay | Admitting: Family Medicine

## 2016-12-19 VITALS — BP 130/88 | HR 84 | Ht 66.0 in | Wt 157.0 lb

## 2016-12-19 DIAGNOSIS — L511 Stevens-Johnson syndrome: Secondary | ICD-10-CM | POA: Diagnosis not present

## 2016-12-19 DIAGNOSIS — B001 Herpesviral vesicular dermatitis: Secondary | ICD-10-CM | POA: Diagnosis not present

## 2016-12-19 NOTE — Progress Notes (Signed)
Name: Diane Glenn [facility]   MRN: [id number]    DOB: Jul 08, 1950   Date:12/19/2016       Progress Note  Subjective  Chief Complaint  Chief Complaint  Patient presents with  . Mouth Lesions    mouth/lip pain-x 1 1/2 weeks- cracking open and bleeding when talks. Dried blood on top and bottom lips- stings and burns. Started Valacyclovir and triamcinolone cream in on Thursday- hasn't really helped    Sudden swelling of lips with vesicles then progressed to crusting/of lips only. Previously treated by dental/oral.   Mouth Lesions   The current episode started more than 1 week ago. The onset was sudden. The problem has been gradually improving. Nothing relieves the symptoms. Nothing aggravates the symptoms. Associated symptoms include mouth sores and sore throat. Pertinent negatives include no fever, no eye itching, no abdominal pain, no constipation, no diarrhea, no nausea, no congestion, no ear discharge, no ear pain, no headaches, no neck pain, no cough, no wheezing, no rash, no eye discharge and no eye redness.    No problem-specific Assessment & Plan notes found for this encounter.   Past Medical History:  Diagnosis Date  . Hypertension   . Restless leg syndrome   . Thyroid disease     Past Surgical History:  Procedure Laterality Date  . COLONOSCOPY  2013   cleared for 10 yrs- Dr Vira Agar?    Family History  Problem Relation Age of Onset  . Diabetes Father   . Stroke Father   . Breast cancer Neg Hx     Social History   Social History  . Marital status: Married    Spouse name: N/A  . Number of children: N/A  . Years of education: N/A   Occupational History  . Not on file.   Social History Main Topics  . Smoking status: Current Every Day Smoker    Packs/day: 1.00    Types: Cigarettes  . Smokeless tobacco: Never Used  . Alcohol use 0.0 oz/week     Comment: seldom  . Drug use: No  . Sexual activity: Yes   Other Topics Concern  . Not on file   Social History  Narrative  . No narrative on file    No Known Allergies  Outpatient Medications Prior to Visit  Medication Sig Dispense Refill  . alendronate (FOSAMAX) 70 MG tablet Take 1 tablet (70 mg total) by mouth every 7 (seven) days. Take with a full glass of water on an empty stomach. 4 tablet 5  . CALCIUM/MAGNESIUM/ZINC FORMULA PO Take 1 tablet by mouth daily.    Hendricks Limes CONTINUING MONTH PAK 1 MG tablet TAKE AS DIRECTED 56 tablet 0  . Cholecalciferol (VITAMIN D-3) 5000 units TABS Take 1 tablet by mouth daily.    . cyclobenzaprine (FLEXERIL) 10 MG tablet TAKE 1 TABLET (10 MG TOTAL) BY MOUTH AT BEDTIME. 30 tablet 5  . fluticasone (FLONASE) 50 MCG/ACT nasal spray INSTILL 1-2 SPRAYS INTO EACH NOSTRIL TWICE DAILY 16 g 2  . gabapentin (NEURONTIN) 300 MG capsule Take 1 capsule by mouth 3 (three) times daily. Dr Lisette Abu    . levothyroxine (SYNTHROID, LEVOTHROID) 75 MCG tablet TAKE 1 TABLET DAILY.NEEDS APPT 90 tablet 1  . metoprolol succinate (TOPROL-XL) 100 MG 24 hr tablet Take 1 tablet (100 mg total) by mouth daily. Take with or immediately following a meal. 90 tablet 1  . rOPINIRole (REQUIP) 0.25 MG tablet Take 1 tablet (0.25 mg total) by mouth at bedtime. 90 tablet 1  .  triamcinolone cream (KENALOG) 0.1 % Apply 1 application topically 2 (two) times daily. 30 g 0  . valACYclovir (VALTREX) 500 MG tablet Take 1 tablet (500 mg total) by mouth 2 (two) times daily. 14 tablet 1  . vitamin B-12 (CYANOCOBALAMIN) 1000 MCG tablet Take 1,000 mcg by mouth daily.    . fluticasone (FLONASE) 50 MCG/ACT nasal spray INSTILL 1-2 SPRAYS INTO EACH NOSTRIL TWICE DAILY 16 g 1  . fluticasone (FLONASE) 50 MCG/ACT nasal spray INSTILL 1-2 SPRAYS INTO EACH NOSTRIL TWICE DAILY 16 g 1  . levothyroxine (SYNTHROID, LEVOTHROID) 75 MCG tablet TAKE ONE TABLET BY MOUTH DAILY **NEEDS APPT** 30 tablet 0  . levothyroxine (SYNTHROID, LEVOTHROID) 75 MCG tablet TAKE ONE TABLET BY MOUTH DAILY **NEEDS APPT** 90 tablet 0  . metoprolol succinate  (TOPROL-XL) 100 MG 24 hr tablet TAKE 1 TABLET DAILY. PLEASEMAKE AN APPOINTMENT WITH   YOUR DOCTOR IN [facility]. 90 tablet 0   No facility-administered medications prior to visit.     Review of Systems  Constitutional: Negative for chills, fever, malaise/fatigue and weight loss.  HENT: Positive for mouth sores and sore throat. Negative for congestion, ear discharge and ear pain.   Eyes: Negative for blurred vision, discharge, redness and itching.  Respiratory: Negative for cough, sputum production, shortness of breath and wheezing.   Cardiovascular: Negative for chest pain, palpitations and leg swelling.  Gastrointestinal: Negative for abdominal pain, blood in stool, constipation, diarrhea, heartburn, melena and nausea.  Genitourinary: Negative for dysuria, frequency, hematuria and urgency.  Musculoskeletal: Negative for back pain, joint pain, myalgias and neck pain.  Skin: Negative for itching and rash.  Neurological: Negative for dizziness, tingling, sensory change, focal weakness and headaches.  Endo/Heme/Allergies: Negative for environmental allergies and polydipsia. Does not bruise/bleed easily.  Psychiatric/Behavioral: Negative for depression and suicidal ideas. The patient is not nervous/anxious and does not have insomnia.      Objective  Vitals:   12/19/16 1331  BP: 130/88  Pulse: 84  Weight: 157 lb (71.2 kg)  Height: 5\' 6"  (1.676 m)    Physical Exam  Constitutional: She is well-developed, well-nourished, and in no distress. No distress.  HENT:  Head: Normocephalic and atraumatic.  Right Ear: External ear normal.  Left Ear: External ear normal.  Nose: Nose normal.  Mouth/Throat: Oropharynx is clear and moist.  Eyes: Pupils are equal, round, and reactive to light. Conjunctivae and EOM are normal. Right eye exhibits no discharge. Left eye exhibits no discharge.  Neck: Normal range of motion. Neck supple. No JVD present. No thyromegaly present.  Cardiovascular: Normal rate,  regular rhythm, normal heart sounds and intact distal pulses.  Exam reveals no gallop and no friction rub.   No murmur heard. Pulmonary/Chest: Effort normal and breath sounds normal. She has no wheezes.  Abdominal: Soft. Bowel sounds are normal. She exhibits no mass. There is no tenderness. There is no guarding.  Musculoskeletal: Normal range of motion. She exhibits no edema.  Lymphadenopathy:    She has no cervical adenopathy.  Neurological: She is alert. She has normal reflexes.  Skin: Skin is warm and dry. She is not diaphoretic.  Lips only crusting with bleeding  Psychiatric: Mood and affect normal.  Nursing note and vitals reviewed.     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 derm now    Dr. Macon [facility]  12/19/16

## 2016-12-19 NOTE — Patient Instructions (Signed)
Stevens-Johnson Syndrome Stevens-Johnson syndrome is a disorder of the mucous membranes and skin. This disorder causes these things to happen:  The mucous membranes become inflamed.  The top layer of skin dies and starts to shed. The more skin that dies, the more serious the disorder becomes.  The body loses fluids quickly.  The body loses its ability to keep germs out.  This condition requires immediate treatment to prevent complications such as:  Too much fluid loss.  Blood infection.  Eye damage.  Skin damage and infection.  Vision loss, if the eyes are affected.  Damage to the lungs, heart, kidneys, or liver.  What are the causes? The most common cause of this condition is an allergic reaction to a medicine. Medicines that are known to cause this condition include:  Antibiotic medicines.  Antiseizure medicines.  Medicine that is used to treat gout.  Cocaine.  NSAIDs.  This condition can also be caused by an infection. In some cases, the cause may not be known. What increases the risk? This condition is more likely to develop in:  People who have a variation in the HLA gene. This variation may be passed down through families (inherited).  People who have a family history of Stevens-Johnson syndrome.  People who have cancer or are having cancer treatment.  People who have a weak body defense system (immune system).  People who have systemic lupus erythematosus.  People of [place], [place], or [place] descent.  What are the signs or symptoms? This condition often begins with several days of flu-like symptoms, such as:  Fever.  Sore throat.  Fatigue.  Headache.  Muscle aches.  Dry cough.  Burning feeling in the eyes.  Then, a painful red or purple rash may develop on the face, trunk, palms, or soles, and spread to other parts of the body. The rash creates blisters and open sores on the skin. If the mucous membranes are affected, the rash may be  in:  The mouth.  The nose.  The eyes.  The genitals.  The digestive tract.  The urinary tract.  Other signs and symptoms include:  Shedding of the skin or mucous membrane.  Swelling of the tongue and face.  Swelling and itching of the skin (hives).  Redness, sensitivity to light, and dryness in the eyes.  Pain in the mouth and throat.  Pain when passing urine.  Pain when swallowing.  How is this diagnosed? This condition is diagnosed with a physical exam. Your health care provider may also do tests, such as:  A biopsy. This involves removing a sample of skin or eye tissue to be looked at under a microscope.  Blood tests  Imaging tests.  If you are having any eye symptoms, you may need to be seen by an eye specialist (ophthalmologist). How is this treated? This condition may be treated by:  Stopping medicines that you are currently taking.  Getting fluids and nourishment through an IV tube or through a tube that is passed through your nose and into your stomach (nasogastric tube).  Gently removing dead skin and putting a moist bandage (dressing) on those areas.  Applying eye drops or having eye surgery.  Using a mouthwash that numbs the mouth and throat to help with swallowing.  Medicines: ? To help you relax (sedatives). ? To control your pain. ? To fight infection (antibiotics). ? To stop skin swelling and itching.  Follow these instructions at home: Medicines  Take over-the-counter and prescription medicines only as told  by your health care provider.  If you were prescribed an antibiotic medicine, take it as told by your health care provider. Do not stop taking the antibiotic even if you start to feel better.  If a medicine triggered your condition, talk with your health care provider before you take the medicine again. Do not take it if your health care provider tells you not to.  Do not start taking any new medicines before you ask your health  care provider if they are safe for you. Other Instructions  Tell all of your health care providers that you have had Stevens-Johnson syndrome. If the condition was caused by a medicine, always tell your health care providers which medicine caused it.  Wear a medical bracelet or necklace that says that you had this condition and tells the cause of it.  Ask your health care provider if you should be tested for the HLA gene.  Keep all follow-up visits as told by your health care provider. This is important. Contact a health care provider if:  You have trouble managing complications of the condition. Get help right away if:  You have flu-like symptoms after you have an infection or after you start a new medicine.  You develop symptoms on your skin or mucous membranes again. This information is not intended to replace advice given to you by your health care provider. Make sure you discuss any questions you have with your health care provider. Document Released: 12/02/2010 Document Revised: 11/17/2015 Document Reviewed: 06/11/2014 [employer] Interactive Patient Education  2018 [employer]  

## 2016-12-30 ENCOUNTER — Other Ambulatory Visit: Payer: Self-pay | Admitting: Family Medicine

## 2016-12-30 DIAGNOSIS — M810 Age-related osteoporosis without current pathological fracture: Secondary | ICD-10-CM

## 2017-01-02 DIAGNOSIS — B001 Herpesviral vesicular dermatitis: Secondary | ICD-10-CM | POA: Diagnosis not present

## 2017-01-03 ENCOUNTER — Other Ambulatory Visit: Payer: Self-pay

## 2017-01-03 DIAGNOSIS — E039 Hypothyroidism, unspecified: Secondary | ICD-10-CM

## 2017-01-03 MED ORDER — LEVOTHYROXINE SODIUM 75 MCG PO TABS: ORAL_TABLET | ORAL | 1 refills | Status: DC

## 2017-01-05 ENCOUNTER — Other Ambulatory Visit: Payer: Self-pay | Admitting: Family Medicine

## 2017-01-05 DIAGNOSIS — R928 Other abnormal and inconclusive findings on diagnostic imaging of breast: Secondary | ICD-10-CM

## 2017-01-10 ENCOUNTER — Other Ambulatory Visit: Payer: Self-pay

## 2017-01-18 ENCOUNTER — Other Ambulatory Visit: Payer: Self-pay | Admitting: Family Medicine

## 2017-01-18 DIAGNOSIS — B001 Herpesviral vesicular dermatitis: Secondary | ICD-10-CM

## 2017-01-20 ENCOUNTER — Other Ambulatory Visit: Payer: Self-pay

## 2017-01-30 ENCOUNTER — Other Ambulatory Visit: Payer: Self-pay

## 2017-01-31 ENCOUNTER — Other Ambulatory Visit: Payer: Self-pay | Admitting: Family Medicine

## 2017-01-31 ENCOUNTER — Ambulatory Visit
Admission: RE | Admit: 2017-01-31 | Discharge: 2017-01-31 | Disposition: A | Discharge status: Home / Self Care | Payer: 59 | Source: Ambulatory Visit | Attending: Family Medicine | Admitting: Family Medicine

## 2017-01-31 DIAGNOSIS — N641 Fat necrosis of breast: Secondary | ICD-10-CM | POA: Diagnosis not present

## 2017-01-31 DIAGNOSIS — R928 Other abnormal and inconclusive findings on diagnostic imaging of breast: Secondary | ICD-10-CM

## 2017-01-31 DIAGNOSIS — N6489 Other specified disorders of breast: Secondary | ICD-10-CM | POA: Insufficient documentation

## 2017-02-01 ENCOUNTER — Other Ambulatory Visit: Payer: Self-pay | Admitting: Family Medicine

## 2017-02-01 DIAGNOSIS — R928 Other abnormal and inconclusive findings on diagnostic imaging of breast: Secondary | ICD-10-CM

## 2017-02-16 ENCOUNTER — Ambulatory Visit
Admission: RE | Admit: 2017-02-16 | Discharge: 2017-02-16 | Disposition: A | Discharge status: Home / Self Care | Payer: 59 | Source: Ambulatory Visit | Attending: Family Medicine | Admitting: Family Medicine

## 2017-02-16 DIAGNOSIS — R920 Mammographic microcalcification found on diagnostic imaging of breast: Secondary | ICD-10-CM | POA: Insufficient documentation

## 2017-02-16 DIAGNOSIS — R928 Other abnormal and inconclusive findings on diagnostic imaging of breast: Secondary | ICD-10-CM | POA: Insufficient documentation

## 2017-02-16 HISTORY — PX: BREAST BIOPSY: SHX20

## 2017-02-17 LAB — SURGICAL PATHOLOGY

## 2017-03-01 ENCOUNTER — Other Ambulatory Visit: Payer: Self-pay | Admitting: Family Medicine

## 2017-03-01 DIAGNOSIS — B001 Herpesviral vesicular dermatitis: Secondary | ICD-10-CM

## 2017-03-04 ENCOUNTER — Other Ambulatory Visit: Payer: Self-pay | Admitting: Family Medicine

## 2017-03-04 DIAGNOSIS — B001 Herpesviral vesicular dermatitis: Secondary | ICD-10-CM

## 2017-04-12 ENCOUNTER — Other Ambulatory Visit: Payer: Self-pay | Admitting: Family Medicine

## 2017-04-12 DIAGNOSIS — M5030 Other cervical disc degeneration, unspecified cervical region: Secondary | ICD-10-CM

## 2017-04-27 ENCOUNTER — Other Ambulatory Visit: Payer: Self-pay | Admitting: Family Medicine

## 2017-04-27 DIAGNOSIS — M5030 Other cervical disc degeneration, unspecified cervical region: Secondary | ICD-10-CM

## 2017-05-01 ENCOUNTER — Other Ambulatory Visit: Payer: Self-pay | Admitting: Family Medicine

## 2017-05-15 ENCOUNTER — Encounter: Payer: Self-pay | Admitting: Family Medicine

## 2017-05-15 ENCOUNTER — Other Ambulatory Visit: Payer: Self-pay

## 2017-05-15 ENCOUNTER — Ambulatory Visit (INDEPENDENT_AMBULATORY_CARE_PROVIDER_SITE_OTHER): Payer: PRIVATE HEALTH INSURANCE | Admitting: Family Medicine

## 2017-05-15 VITALS — BP 130/84 | HR 80 | Ht 66.0 in | Wt 162.0 lb

## 2017-05-15 DIAGNOSIS — Z716 Tobacco abuse counseling: Secondary | ICD-10-CM

## 2017-05-15 DIAGNOSIS — F17200 Nicotine dependence, unspecified, uncomplicated: Secondary | ICD-10-CM

## 2017-05-15 DIAGNOSIS — G2581 Restless legs syndrome: Secondary | ICD-10-CM | POA: Diagnosis not present

## 2017-05-15 DIAGNOSIS — E039 Hypothyroidism, unspecified: Secondary | ICD-10-CM | POA: Diagnosis not present

## 2017-05-15 DIAGNOSIS — F17210 Nicotine dependence, cigarettes, uncomplicated: Secondary | ICD-10-CM

## 2017-05-15 DIAGNOSIS — F329 Major depressive disorder, single episode, unspecified: Secondary | ICD-10-CM | POA: Insufficient documentation

## 2017-05-15 DIAGNOSIS — I10 Essential (primary) hypertension: Secondary | ICD-10-CM

## 2017-05-15 MED ORDER — METOPROLOL SUCCINATE ER 100 MG PO TB24
100.0000 mg | ORAL_TABLET | Freq: Every day | ORAL | 1 refills | Status: DC
Start: 2017-05-15 — End: 2017-11-06

## 2017-05-15 MED ORDER — ROPINIROLE HCL 0.25 MG PO TABS: 0.2500 mg | ORAL_TABLET | Freq: Every day | ORAL | 1 refills | Status: DC

## 2017-05-15 MED ORDER — NICOTINE 21 MG/24HR TD PT24: 21.0000 mg | MEDICATED_PATCH | Freq: Every day | TRANSDERMAL | 1 refills | Status: DC

## 2017-05-15 MED ORDER — BUPROPION HCL ER (SR) 150 MG PO TB12: 150.0000 mg | ORAL_TABLET | Freq: Every day | ORAL | 0 refills | Status: DC

## 2017-05-15 MED ORDER — LEVOTHYROXINE SODIUM 75 MCG PO TABS: ORAL_TABLET | ORAL | 1 refills | Status: DC

## 2017-05-15 NOTE — Patient Instructions (Signed)
Bupropion sustained-release tablets (smoking cessation)  What is this medicine?  BUPROPION (byoo PROE pee on) is used to help people quit smoking.  This medicine may be used for other purposes; ask your health care provider or pharmacist if you have questions.  COMMON BRAND NAME(S): Buproban, Zyban  What should I tell my health care provider before I take this medicine?  They need to know if you have any of these conditions:  -an eating disorder, such as anorexia or bulimia  -bipolar disorder or psychosis  -diabetes or high blood sugar, treated with medication  -glaucoma  -head injury or brain tumor  -heart disease, previous heart attack, or irregular heart beat  -high blood pressure  -kidney or liver disease  -seizures  -suicidal thoughts or a previous suicide attempt  -Tourette's syndrome  -weight loss  -an unusual or allergic reaction to bupropion, other medicines, foods, dyes, or preservatives  -breast-feeding  -pregnant or trying to become pregnant  How should I use this medicine?  Take this medicine by mouth with a glass of water. Follow the directions on the prescription label. You can take it with or without food. If it upsets your stomach, take it with food. Do not cut, crush or chew this medicine. Take your medicine at regular intervals. If you take this medicine more than once a day, take your second dose at least 8 hours after you take your first dose. To limit difficulty in sleeping, avoid taking this medicine at bedtime. Do not take your medicine more often than directed. Do not stop taking this medicine suddenly except upon the advice of your doctor. Stopping this medicine too quickly may cause serious side effects.  A special MedGuide will be given to you by the pharmacist with each prescription and refill. Be sure to read this information carefully each time.  Talk to your pediatrician regarding the use of this medicine in children. Special care may be needed.  Overdosage: If you think you have  taken too much of this medicine contact a poison control center or emergency room at once.  NOTE: This medicine is only for you. Do not share this medicine with others.  What if I miss a dose?  If you miss a dose, skip the missed dose and take your next tablet at the regular time. There should be at least 8 hours between doses. Do not take double or extra doses.  What may interact with this medicine?  Do not take this medicine with any of the following medications:  -linezolid  -MAOIs like Azilect, Carbex, Eldepryl, Marplan, Nardil, and Parnate  -methylene blue (injected into a vein)  -other medicines that contain bupropion like Wellbutrin  This medicine may also interact with the following medications:  -alcohol  -certain medicines for anxiety or sleep  -certain medicines for blood pressure like metoprolol, propranolol  -certain medicines for depression or psychotic disturbances  -certain medicines for HIV or AIDS like efavirenz, lopinavir, nelfinavir, ritonavir  -certain medicines for irregular heart beat like propafenone, flecainide  -certain medicines for Parkinson's disease like amantadine, levodopa  -certain medicines for seizures like carbamazepine, phenytoin, phenobarbital  -cimetidine  -clopidogrel  -cyclophosphamide  -digoxin  -furazolidone  -isoniazid  -nicotine  -orphenadrine  -procarbazine  -steroid medicines like prednisone or cortisone  -stimulant medicines for attention disorders, weight loss, or to stay awake  -tamoxifen  -theophylline  -thiotepa  -ticlopidine  -tramadol  -warfarin  This list may not describe all possible interactions. Give your health care provider a list   patient support program. It is important to participate in a behavioral program, counseling, or other support program that is recommended by your health care professional. Patients and their families should watch out for new or worsening thoughts of suicide or depression. Also watch out for sudden changes in feelings such as feeling anxious, agitated, panicky, irritable, hostile, aggressive, impulsive, severely restless, overly excited and hyperactive, or not being able to sleep. If this happens, especially at the beginning of treatment or after a change in dose, call your health care professional. Avoid alcoholic drinks while taking this medicine. Drinking excessive alcoholic beverages, using sleeping or anxiety medicines, or quickly stopping the use of these agents while taking this medicine may increase your risk for a seizure. Do not drive or use heavy machinery until you know how this medicine affects you. This medicine can impair your ability to perform these tasks. Do not take this medicine close to bedtime. It may prevent you from sleeping. Your mouth may get dry. Chewing sugarless gum or sucking hard candy, and drinking plenty of water may help. Contact your doctor if the problem does not go away or is severe. Do not use nicotine patches or chewing gum without the advice of your doctor or health care professional while taking this medicine. You may need to have your blood pressure taken regularly if your doctor recommends that you use both nicotine and this medicine together. What side effects may I notice from receiving this medicine? Side effects that you should report to your doctor or health care professional as soon as possible: -allergic reactions like skin rash, itching or hives, swelling of the face, lips, or tongue -breathing problems -changes in vision -confusion -elevated mood, decreased need for sleep, racing thoughts, impulsive  behavior -fast or irregular heartbeat -hallucinations, loss of contact with reality -increased blood pressure -redness, blistering, peeling or loosening of the skin, including inside the mouth -seizures -suicidal thoughts or other mood changes -unusually weak or tired -vomiting Side effects that usually do not require medical attention (report to your doctor or health care professional if they continue or are bothersome): -constipation -headache -loss of appetite -nausea -tremors -weight loss This list may not describe all possible side effects. Call your doctor for medical advice about side effects. You may report side effects to FDA at 1-800-FDA-1088. Where should I keep my medicine? Keep out of the reach of children. Store at room temperature between 20 and 25 degrees C (68 and 77 degrees F). Protect from light. Keep container tightly closed. Throw away any unused medicine after the expiration date. NOTE: This sheet is a summary. It may not cover all possible information. If you have questions about this medicine, talk to your doctor, pharmacist, or health care provider.  2018 Elsevier/Gold Standard (2015-09-11 13:49:28) Depression Screening Depression screening is a tool that your health care provider can use to learn if you have symptoms of depression. Depression is a common condition with many symptoms that are also often found in other conditions. Depression is treatable, but it must first be diagnosed. You may not know that certain feelings, thoughts, and behaviors that you are having can be symptoms of depression. Taking a depression screening test can help you and your health care provider decide if you need more assessment, or if you should be referred to a mental health care provider. What are the screening tests?  You may have a physical exam to see if another condition is affecting your mental health. You may have a blood  or urine sample taken during the physical exam.  You  may be interviewed using a screening tool that was developed from research, such as one of these: ? Patient Health Questionnaire (PHQ). This is a set of either 2 or 9 questions. A health care provider who has been trained to score this screening test uses a guide to assess if your symptoms suggest that you may have depression. ? Hamilton Depression Rating Scale (HAM-D). This is a set of either 17 or 24 questions. You may be asked to take it again during or after your treatment, to see if your depression has gotten better. ? Beck Depression Inventory (BDI). This is a set of 21 multiple choice questions. Your health care provider scores your answers to assess:  Your level of depression, ranging from mild to severe.  Your response to treatment.  Your health care provider may talk with you about your daily activities, such as eating, sleeping, work, and recreation, and ask if you have had any changes in activity.  Your health care provider may ask you to see a mental health specialist, such as a psychiatrist or psychologist, for more evaluation. Who should be screened for depression?  All adults, including adults with a family history of a mental health disorder.  Adolescents who are 59-73 years old.  People who are recovering from a myocardial infarction (MI).  Pregnant women, or women who have given birth.  People who have a long-term (chronic) illness.  Anyone who has been diagnosed with another type of a mental health disorder.  Anyone who has symptoms that could show depression. What do my results mean? Your health care provider will review the results of your depression screening, physical exam, and lab tests. Positive screens suggest that you may have depression. Screening is the first step in getting the care that you may need. It is up to you to get your screening results. Ask your health care provider, or the department that is doing your screening tests, when your results will be  ready. Talk with your health care provider about your results and diagnosis. A diagnosis of depression is made using the Diagnostic and Statistical Manual of Mental Disorders (DSM-V). This is a book that lists the number and type of symptoms that must be present for a health care provider to give a specific diagnosis.  Your health care provider may work with you to treat your symptoms of depression, or your health care provider may help you find a mental health provider who can assess, diagnose, and treat your depression. Get help right away if:  You have thoughts about hurting yourself or others. If you ever feel like you may hurt yourself or others, or have thoughts about taking your own life, get help right away. You can go to your nearest emergency department or call:  Your local emergency services (911 in the [place]  A suicide crisis helpline, such as the [place] at 334-870-1040. This is open 24 hours a day.  Summary  Depression screening is the first step in getting the help that you may need.  If your screening test shows symptoms of depression (is positive), your health care provider may ask you to see a mental health provider.  Anyone who is age 2 or older should be screened for depression. This information is not intended to replace advice given to you by your health care provider. Make sure you discuss any questions you have with your health care provider. Document Released:  08/05/2016 Document Revised: 08/05/2016 Document Reviewed: 08/05/2016 Elsevier Interactive Patient Education  2018 [place] FOR  LOW-CHOLESTEROL, LOW-TRIGLYCERIDE DIETS    FOODS TO USE   MEATS, FISH Choose lean meats (chicken, [place], veal, and non-fatty cuts of beef with excess fat trimmed; one serving = 3 oz of cooked meat). Also, fresh or frozen fish, canned fish packed in water, and shellfish (lobster, crabs, shrimp, and oysters). Limit use to no more than  one serving of one of these per week. Shellfish are high in cholesterol but low in saturated fat and should be used sparingly. Meats and fish should be broiled (pan or oven) or baked on a rack.  EGGS Egg substitutes and egg whites (use freely). Egg yolks (limit two per week).  FRUITS Eat three servings of fresh fruit per day (1 serving =  cup). Be sure to have at least one citrus fruit daily. Frozen and canned fruit with no sugar or syrup added may be used.  VEGETABLES Most vegetables are not limited (see next page). One dark-green (string beans, escarole) or one deep yellow (squash) vegetable is recommended daily. Cauliflower, broccoli, and celery, as well as potato skins, are recommended for their fiber content. (Fiber is associated with cholesterol reduction) It is preferable to steam vegetables, but they may be boiled, strained, or braised with polyunsaturated vegetable oil (see below).  BEANS Dried peas or beans (1 serving =  cup) may be used as a bread substitute.  NUTS Almonds, walnuts, and peanuts may be used sparingly  (1 serving = 1 Tablespoonful). Use pumpkin, sesame, or sunflower seeds.  BREADS, GRAINS One roll or one slice of whole grain or enriched bread may be used, or three soda crackers or four pieces of melba toast as a substitute. Spaghetti, rice or noodles ( cup) or  large ear of corn may be used as a bread substitute. In preparing these foods do not use butter or shortening, use soft margarine. Also use egg and sugar substitutes.  Choose high fiber grains, such as oats and whole wheat.  CEREALS Use  cup of hot cereal or  cup of cold cereal per day. Add a sugar substitute if desired, with 99% fat free or skim milk.  MILK PRODUCTS Always use 99% fat free or skim milk, dairy products such as low fat cheeses (farmer's uncreamed diet cottage), low-fat yogurt, and powdered skim milk.  FATS, OILS Use soft (not stick) margarine; vegetable oils that are high in polyunsaturated fats (such  as safflower, sunflower, soybean, corn, and cottonseed). Always refrigerate meat drippings to harden the fat and remove it before preparing gravies  DESSERTS, SNACKS Limit to two servings per day; substitute each serving for a bread/cereal serving: ice milk, water sherbet (1/4 cup); unflavored gelatin or gelatin flavored with sugar substitute (1/3 cup); pudding prepared with skim milk (1/2 cup); egg white souffls; unbuttered popcorn (1  cups). Substitute carob for chocolate.  BEVERAGES Fresh fruit juices (limit 4 oz per day); black coffee, plain or herbal teas; soft drinks with sugar substitutes; club soda, preferably salt-free; cocoa made with skim milk or nonfat dried milk and water (sugar substitute added if desired); clear broth. Alcohol: limit two servings per day (see second page).  MISCELLANEOUS  You may use the following freely: vinegar, spices, herbs, nonfat bouillon, mustard, Worcestershire sauce, soy sauce, flavoring essence.                  GUIDELINES FOR  LOW-CHOLESTEROL, LOW TRIGLYCERIDE DIETS    FOODS  TO AVOID   MEATS, FISH Marbled beef, pork, bacon, sausage, and other pork products; fatty fowl (duck, goose); skin and fat of [place] and chicken; processed meats; luncheon meats (salami, bologna); frankfurters and fast-food hamburgers (theyre loaded with fat); organ meats (kidneys, liver); canned fish packed in oil.  EGGS Limit egg yolks to two per week.   FRUITS Coconuts (rich in saturated fats).  VEGETABLES Avoid avocados. Starchy vegetables (potatoes, corn, lima beans, dried peas, beans) may be used only if substitutes for a serving of bread or cereal. (Baked potato skin, however, is desirable for its fiber content.  BEANS Commercial baked beans with sugar and/or pork added.  NUTS Avoid nuts.  Limit peanuts and walnuts to one tablespoonful per day.  BREADS, GRAINS Any baked goods with shortening and/or sugar. Commercial mixes with dried eggs and whole milk. Avoid  sweet rolls, doughnuts, breakfast pastries (Danish), and sweetened packaged cereals (the added sugar converts readily to triglycerides).  MILK PRODUCTS Whole milk and whole-milk packaged goods; cream; ice cream; whole-milk puddings, yogurt, or cheeses; nondairy cream substitutes.  FATS, OILS Butter, lard, animal fats, bacon drippings, gravies, cream sauces as well as palm and coconut oils. All these are high in saturated fats. Examine labels on cholesterol free products for hydrogenated fats. (These are oils that have been hardened into solids and in the process have become saturated.)  DESSERTS, SNACKS Fried snack foods like potato chips; chocolate; candies in general; jams, jellies, syrups; whole- milk puddings; ice cream and milk sherbets; hydrogenated peanut butter.  BEVERAGES Sugared fruit juices and soft drinks; cocoa made with whole milk and/or sugar. When using alcohol (1 oz liquor, 5 oz beer, or 2  oz dry table wine per serving), one serving must be substituted for one bread or cereal serving (limit, two servings of alcohol per day).   SPECIAL NOTES    1. Remember that even non-limited foods should be used in moderation. 2. While on a cholesterol-lowering diet, be sure to avoid animal fats and marbled meats. 3. 3. While on a triglyceride-lowering diet, be sure to avoid sweets and to control the amount of carbohydrates you eat (starchy foods such as flour, bread, potatoes).While on a tri-glyceride-lowering diet, be sure to avoid sweets 4. Buy a good low-fat cookbook, such as the one published by the American Heart Association. 5. Consult your physician if you have any questions.               [facility] Low Glycemic Diet Plan   Low Glycemic Foods (20-49) Moderate Glycemic Foods (50-69) High Glycemic Foods (70-100)      Breakfast Creals Breakfast Cereals Breakfast Cereals  All Bran All-Bran Fruit'n Oats   Bran Buds Bran Chex   Cheerios Corn chex    Fiber One Oatmeal  (not instant)   Just Right Mini-Wheats   Corn Flakes Cream of Wheat    Oat Bran Special K Swiss Muesli   Grape Nuts Grape Nut Flakes      Grits Nutri-Grain    Fruits and fruit juice: Fruits Puffed Rice Puffed Wheat    (Limit to 1-2 Servings per day) Banana (under-ride) Dates   Rice Chex Rice Krispies    Apples Apricots (fresh/dried)   Figs Grapes   Shredded Wheat Team    Blackberries Blueberries   Kiwi Mango   Total     Cherries Cranberries   Oranges Raisins     Peaches Pears    Fruits  Plums Prunes   Fruit Juices Pineapple Watermelon  Grapefruit Raspberries   Cranberry Juice Orange Juice   Banana (over-ripe)     Strawberries Tangerines      Apple Juice Grapefruit Juice   Beans and Legumes Beverages  Tomato Juice    [place]-type baked beans Sodas, sweet tea, pineapple juice   Canned pinto, kidney, or navy beans   Beans and Legumes (fresh-cooked) Green peas Vegetables  Black-eyed peas Butter Beans    Potato, baked, boiled, fried, mashed  Chick peas Lentils   Vegetables [place] fries  Green beans Lima beans   Beets Carrots   Canned or frozen corn  Kidney beans Navy beans   Sweet potato Yam   Parsnips  Pinto beans Snow peas   Corn on the cob Winter squash      Non-starchy vegetables Grains Breads  Asparagus, avocado, broccoli, cabbage Cornmeal Rice, brown   Most breads (white and whole grain)  cauliflower, celery, cucumber, greens Rice, white Couscous   Bagels Bread sticks    lettuce, mushrooms, peppers, tomatoes  Bread stuffing Kaiser roll    okra, onions, spinach, summer squash Pasta Dinner rolls   [employer], cheese     Grains Ravioli, meat filled Spaghetti, white   Grains  Barley Bulgur    Rice, instant Tapioca, with milk    Rye Wild rice   Nuts    Cashews Macadamia   Candy and most cookies  Nuts and oils    Almonds, peanuts, sunflower seeds Snacks Snacks  hazelnuts, pecans, walnuts Chocolate Ice cream, lowfat   Donuts Corn chips     Oils that are liquid at room temperature Muffin Popcorn   Jelly beans Pretzels      Pastries  Dairy, fish, meat, soy, and eggs    Milk, skim Lowfat cheese    Restaurant and ethnic foods  Yogurt, lowfat, fruit sugar sweetened  Most [place] food (sugar in stir fry    or wok sauce)  Lean red meat Fish    Teriyaki-style meats and vegetables  Skinless chicken and [place], shellfish        Egg whites (up to 3 daily), Soy Products    Egg yolks (up to 7 or _____ per week)

## 2017-05-15 NOTE — Progress Notes (Signed)
Name: Diane Glenn [facility]   MRN: [id number]    DOB: 1950/11/12   Date:05/15/2017       Progress Note  Subjective  Chief Complaint  Chief Complaint  Patient presents with  . Hypertension  . Hypothyroidism  . restless leg  . Osteoporosis  . Nicotine Dependence    switch to patches    Patient presents for osteoporosis   Hypertension  This is a chronic problem. The current episode started more than 1 year ago. The problem is unchanged. The problem is controlled. Pertinent negatives include no anxiety, blurred vision, chest pain, headaches, malaise/fatigue, neck pain, orthopnea, palpitations, peripheral edema, PND, shortness of breath or sweats. There are no associated agents to hypertension. There are no known risk factors for coronary artery disease. Past treatments include beta blockers. The current treatment provides moderate improvement. There are no compliance problems.  There is no history of angina, kidney disease, CAD/MI, CVA, heart failure, left ventricular hypertrophy, PVD or retinopathy. Identifiable causes of hypertension include a thyroid problem. There is no history of chronic renal disease, a hypertension causing med or renovascular disease.  Nicotine Dependence  Presents for follow-up visit. Symptoms include insomnia. Symptoms are negative for fatigue and sore throat. Her urge triggers include company of smokers. The symptoms have been stable. Compliance with prior treatments has been variable.  Thyroid Problem  Presents for follow-up visit. Patient reports no anxiety, cold intolerance, constipation, depressed mood, diaphoresis, diarrhea, dry skin, fatigue, hair loss, heat intolerance, hoarse voice, leg swelling, menstrual problem, nail problem, palpitations, tremors, visual change, weight gain or weight loss. The symptoms have been stable. Her past medical history is significant for hyperlipidemia. There is no history of heart failure.  Depression       The patient presents with  depression.  This is a new problem.  The current episode started 1 to 4 weeks ago.   The onset quality is sudden.   Associated symptoms include helplessness, insomnia, decreased interest and sad.  Associated symptoms include no fatigue, no hopelessness, not irritable, no restlessness, no appetite change, no body aches, no myalgias, no headaches, no indigestion and no suicidal ideas.     The symptoms are aggravated by nothing.  Past treatments include nothing.  Past compliance problems include medical issues.  Past medical history includes thyroid problem and depression.     Pertinent negatives include no anxiety. Hyperlipidemia  This is a new problem. The current episode started more than 1 year ago. The problem is controlled. Recent lipid tests were reviewed and are normal. She has no history of chronic renal disease. Pertinent negatives include no chest pain, focal weakness, myalgias or shortness of breath. Current antihyperlipidemic treatment includes diet change.  Gastroesophageal Reflux  She reports no abdominal pain, no belching, no chest pain, no choking, no coughing, no dysphagia, no heartburn, no hoarse voice, no nausea, no sore throat or no wheezing. This is a recurrent problem. The problem has been waxing and waning. Pertinent negatives include no fatigue, melena or weight loss.    No problem-specific Assessment & Plan notes found for this encounter.   Past Medical History:  Diagnosis Date  . Hypertension   . Restless leg syndrome   . Thyroid disease     Past Surgical History:  Procedure Laterality Date  . BREAST BIOPSY Left 02/16/2017   left stereo path pend  . COLONOSCOPY  2013   cleared for 10 yrs- Dr Vira Agar?    Family History  Problem Relation Age of Onset  .  Diabetes Father   . Stroke Father   . Breast cancer Neg Hx     Social History   Socioeconomic History  . Marital status: Married    Spouse name: Not on file  . Number of children: Not on file  . Years of  education: Not on file  . Highest education level: Not on file  Social Needs  . Financial resource strain: Not on file  . Food insecurity - worry: Not on file  . Food insecurity - inability: Not on file  . Transportation needs - medical: Not on file  . Transportation needs - non-medical: Not on file  Occupational History  . Not on file  Tobacco Use  . Smoking status: Current Every Day Smoker    Packs/day: 1.00    Types: Cigarettes  . Smokeless tobacco: Never Used  Substance and Sexual Activity  . Alcohol use: Yes    Alcohol/week: 0.0 oz    Comment: seldom  . Drug use: No  . Sexual activity: Yes  Other Topics Concern  . Not on file  Social History Narrative  . Not on file    No Known Allergies  Outpatient Medications Prior to Visit  Medication Sig Dispense Refill  . CALCIUM/MAGNESIUM/ZINC FORMULA PO Take 1 tablet by mouth daily.    Hendricks Limes CONTINUING MONTH PAK 1 MG tablet TAKE AS DIRECTED 56 tablet 0  . Cholecalciferol (VITAMIN D-3) 5000 units TABS Take 1 tablet by mouth daily.    . fluticasone (FLONASE) 50 MCG/ACT nasal spray INSTILL 1-2 SPRAYS INTO EACH NOSTRIL TWICE DAILY 16 g 2  . gabapentin (NEURONTIN) 300 MG capsule Take 1 capsule by mouth 3 (three) times daily. Dr Lisette Abu    . vitamin B-12 (CYANOCOBALAMIN) 1000 MCG tablet Take 1,000 mcg by mouth daily.    Marland Kitchen alendronate (FOSAMAX) 70 MG tablet Take 1 tablet (70 mg total) by mouth every 7 (seven) days. Take with a full glass of water on an empty stomach. 4 tablet 5  . cyclobenzaprine (FLEXERIL) 10 MG tablet TAKE 1 TABLET BY MOUTH EVERYDAY AT BEDTIME 30 tablet 0  . levothyroxine (SYNTHROID, LEVOTHROID) 75 MCG tablet TAKE 1 TABLET DAILY.NEEDS APPT 90 tablet 1  . metoprolol succinate (TOPROL-XL) 100 MG 24 hr tablet Take 1 tablet (100 mg total) by mouth daily. Take with or immediately following a meal. 90 tablet 1  . rOPINIRole (REQUIP) 0.25 MG tablet Take 1 tablet (0.25 mg total) by mouth at bedtime. 90 tablet 1  .  valACYclovir (VALTREX) 500 MG tablet TAKE 1 TABLET BY MOUTH TWICE A DAY 42 tablet 0  . alendronate (FOSAMAX) 70 MG tablet TAKE 1 TABLET BY MOUTH EVERY 7 DAYS. WITH A FULL GLASS OF WATER ON A EMPTY STOMACH 4 tablet 5  . metoprolol succinate (TOPROL-XL) 100 MG 24 hr tablet TAKE 1 TABLET DAILY. PLEASEMAKE AN APPOINTMENT WITH   [facility]. 15 tablet 0  . triamcinolone cream (KENALOG) 0.1 % APPLY TO AFFECTED AREA TWICE A DAY 30 g 0   No facility-administered medications prior to visit.     Review of Systems  Constitutional: Negative for appetite change, chills, diaphoresis, fatigue, fever, malaise/fatigue, weight gain and weight loss.  HENT: Negative for ear discharge, ear pain, hoarse voice and sore throat.   Eyes: Negative for blurred vision.  Respiratory: Negative for cough, sputum production, choking, shortness of breath and wheezing.   Cardiovascular: Negative for chest pain, palpitations, orthopnea, leg swelling and PND.  Gastrointestinal: Negative for abdominal pain, blood  in stool, constipation, diarrhea, dysphagia, heartburn, melena and nausea.  Genitourinary: Negative for dysuria, frequency, hematuria, menstrual problem and urgency.  Musculoskeletal: Negative for back pain, joint pain, myalgias and neck pain.  Skin: Negative for rash.  Neurological: Negative for dizziness, tingling, tremors, sensory change, focal weakness and headaches.  Endo/Heme/Allergies: Negative for environmental allergies, cold intolerance, heat intolerance and polydipsia. Does not bruise/bleed easily.  Psychiatric/Behavioral: Positive for depression. Negative for suicidal ideas. The patient has insomnia. The patient is not nervous/anxious.      Objective  Vitals:   05/15/17 1358  BP: 130/84  Pulse: 80  Weight: 162 lb (73.5 kg)  Height: 5\' 6"  (1.676 m)    Physical Exam  Constitutional: She is well-developed, well-nourished, and in no distress. She is not irritable. No distress.  HENT:  Head:  Normocephalic and atraumatic.  Right Ear: External ear normal.  Left Ear: External ear normal.  Nose: Nose normal.  Mouth/Throat: Oropharynx is clear and moist.  Eyes: Conjunctivae and EOM are normal. Pupils are equal, round, and reactive to light. Right eye exhibits no discharge. Left eye exhibits no discharge.  Neck: Normal range of motion. Neck supple. No JVD present. No thyromegaly present.  Cardiovascular: Normal rate, regular rhythm, S1 normal, S2 normal, normal heart sounds and intact distal pulses. Exam reveals no gallop, no S3, no S4 and no friction rub.  No murmur heard. Pulmonary/Chest: Effort normal and breath sounds normal. She has no wheezes. She has no rales.  Abdominal: Soft. Bowel sounds are normal. She exhibits no mass. There is no tenderness. There is no guarding.  Musculoskeletal: Normal range of motion. She exhibits no edema.  Lymphadenopathy:    She has no cervical adenopathy.  Neurological: She is alert. She has normal reflexes.  Skin: Skin is warm and dry. She is not diaphoretic.  Psychiatric: Mood and affect normal.  Nursing note and vitals reviewed.     Assessment & Plan  Problem List Items Addressed This Visit      Cardiovascular and Mediastinum   Essential hypertension - Primary   Relevant Medications   metoprolol succinate (TOPROL-XL) 100 MG 24 hr tablet     Endocrine   Thyroid activity decreased   Relevant Medications   levothyroxine (SYNTHROID, LEVOTHROID) 75 MCG tablet   metoprolol succinate (TOPROL-XL) 100 MG 24 hr tablet     Other   Restless leg syndrome   Relevant Medications   rOPINIRole (REQUIP) 0.25 MG tablet   Cigarette nicotine dependence without complication   Relevant Medications   nicotine (NICODERM CQ) 21 mg/24hr patch   Reactive depression      Meds ordered this encounter  Medications  . rOPINIRole (REQUIP) 0.25 MG tablet    Sig: Take 1 tablet (0.25 mg total) by mouth at bedtime.    Dispense:  90 tablet    Refill:  1   . levothyroxine (SYNTHROID, LEVOTHROID) 75 MCG tablet    Sig: TAKE 1 TABLET DAILY.NEEDS APPT    Dispense:  90 tablet    Refill:  1  . metoprolol succinate (TOPROL-XL) 100 MG 24 hr tablet    Sig: Take 1 tablet (100 mg total) by mouth daily. Take with or immediately following a meal.    Dispense:  90 tablet    Refill:  1  . nicotine (NICODERM CQ) 21 mg/24hr patch    Sig: Place 1 patch (21 mg total) onto the skin daily.    Dispense:  28 patch    Refill:  1  Patient has been  advised of the health risks of smoking and counseled concerning cessation of tobacco products. I spent over 3 minutes for discussion and to answer questions. Health risks of being over weight were discussed and patient was counseled on weight loss options and exercise.  Dr. Mac[facility]  05/15/17

## 2017-05-25 ENCOUNTER — Other Ambulatory Visit: Payer: Self-pay

## 2017-05-25 ENCOUNTER — Telehealth: Payer: Self-pay

## 2017-05-25 MED ORDER — ALPRAZOLAM 0.25 MG PO TABS: 0.2500 mg | ORAL_TABLET | Freq: Every evening | ORAL | 0 refills | Status: DC | PRN

## 2017-05-25 NOTE — Telephone Encounter (Signed)
Pt called needing something for anxiety, on day 10 of not smoking. Called in Alprazolam 0.25mg  qday prn anxiety #20 with 0 refills to [facility] [place]. Told pt- needed to see her in follow up to document the use of med since it is controlled.

## 2017-05-30 ENCOUNTER — Encounter: Payer: Self-pay | Admitting: Family Medicine

## 2017-05-30 ENCOUNTER — Ambulatory Visit (INDEPENDENT_AMBULATORY_CARE_PROVIDER_SITE_OTHER): Payer: PRIVATE HEALTH INSURANCE | Admitting: Family Medicine

## 2017-05-30 VITALS — BP 120/80 | HR 72 | Ht 66.0 in | Wt 161.0 lb

## 2017-05-30 DIAGNOSIS — F419 Anxiety disorder, unspecified: Secondary | ICD-10-CM | POA: Diagnosis not present

## 2017-05-30 DIAGNOSIS — E785 Hyperlipidemia, unspecified: Secondary | ICD-10-CM

## 2017-05-30 DIAGNOSIS — I10 Essential (primary) hypertension: Secondary | ICD-10-CM | POA: Diagnosis not present

## 2017-05-30 DIAGNOSIS — E039 Hypothyroidism, unspecified: Secondary | ICD-10-CM | POA: Diagnosis not present

## 2017-05-30 DIAGNOSIS — Z716 Tobacco abuse counseling: Secondary | ICD-10-CM | POA: Diagnosis not present

## 2017-05-30 DIAGNOSIS — F17200 Nicotine dependence, unspecified, uncomplicated: Secondary | ICD-10-CM

## 2017-05-30 MED ORDER — BUPROPION HCL ER (SR) 150 MG PO TB12: 150.0000 mg | ORAL_TABLET | Freq: Every day | ORAL | 0 refills | Status: DC

## 2017-05-30 MED ORDER — ALPRAZOLAM 0.25 MG PO TABS: 0.2500 mg | ORAL_TABLET | Freq: Every evening | ORAL | 0 refills | Status: DC | PRN

## 2017-05-30 MED ORDER — ALPRAZOLAM 0.25 MG PO TABS: 0.2500 mg | ORAL_TABLET | Freq: Every evening | ORAL | 2 refills | Status: DC | PRN

## 2017-05-30 NOTE — Progress Notes (Signed)
Name: Diane Glenn [facility]   MRN: [id number]    DOB: October 31, 1950   Date:05/30/2017       Progress Note  Subjective  Chief Complaint  Chief Complaint  Patient presents with  . Follow-up    needs lipid, renal and thyroid recheck  . Anxiety    we called in Xanax last week for smoking cessation    Anxiety  Presents for follow-up visit. Symptoms include nervous/anxious behavior. Patient reports no chest pain, compulsions, confusion, decreased concentration, depressed mood, dizziness, dry mouth, excessive worry, feeling of choking, hyperventilation, impotence, insomnia, irritability, malaise, muscle tension, nausea, obsessions, palpitations, panic, restlessness, shortness of breath or suicidal ideas. Symptoms occur occasionally. The severity of symptoms is mild. The quality of sleep is good. Nighttime awakenings: occasional.      No problem-specific Assessment & Plan notes found for this encounter.   Past Medical History:  Diagnosis Date  . Hypertension   . Restless leg syndrome   . Thyroid disease     Past Surgical History:  Procedure Laterality Date  . BREAST BIOPSY Left 02/16/2017   left stereo path pend  . COLONOSCOPY  2013   cleared for 10 yrs- Dr Vira Agar?    Family History  Problem Relation Age of Onset  . Diabetes Father   . Stroke Father   . Breast cancer Neg Hx     Social History   Socioeconomic History  . Marital status: Married    Spouse name: Not on file  . Number of children: Not on file  . Years of education: Not on file  . Highest education level: Not on file  Social Needs  . Financial resource strain: Not on file  . Food insecurity - worry: Not on file  . Food insecurity - inability: Not on file  . Transportation needs - medical: Not on file  . Transportation needs - non-medical: Not on file  Occupational History  . Not on file  Tobacco Use  . Smoking status: Current Some Day Smoker    Packs/day: 1.00    Types: Cigarettes  . Smokeless tobacco:  Never Used  Substance and Sexual Activity  . Alcohol use: Yes    Alcohol/week: 0.0 oz    Comment: seldom  . Drug use: No  . Sexual activity: Yes  Other Topics Concern  . Not on file  Social History Narrative  . Not on file    No Known Allergies  Outpatient Medications Prior to Visit  Medication Sig Dispense Refill  . CALCIUM/MAGNESIUM/ZINC FORMULA PO Take 1 tablet by mouth daily.    Hendricks Limes CONTINUING MONTH PAK 1 MG tablet TAKE AS DIRECTED 56 tablet 0  . Cholecalciferol (VITAMIN D-3) 5000 units TABS Take 1 tablet by mouth daily.    . fluticasone (FLONASE) 50 MCG/ACT nasal spray INSTILL 1-2 SPRAYS INTO EACH NOSTRIL TWICE DAILY 16 g 2  . gabapentin (NEURONTIN) 300 MG capsule Take 1 capsule by mouth 3 (three) times daily. Dr Lisette Abu    . levothyroxine (SYNTHROID, LEVOTHROID) 75 MCG tablet TAKE 1 TABLET DAILY.NEEDS APPT 90 tablet 1  . metoprolol succinate (TOPROL-XL) 100 MG 24 hr tablet Take 1 tablet (100 mg total) by mouth daily. Take with or immediately following a meal. 90 tablet 1  . nicotine (NICODERM CQ) 21 mg/24hr patch Place 1 patch (21 mg total) onto the skin daily. 28 patch 1  . rOPINIRole (REQUIP) 0.25 MG tablet Take 1 tablet (0.25 mg total) by mouth at bedtime. 90 tablet 1  . vitamin B-12 (  CYANOCOBALAMIN) 1000 MCG tablet Take 1,000 mcg by mouth daily.    Marland Kitchen ALPRAZolam (XANAX) 0.25 MG tablet Take 1 tablet (0.25 mg total) by mouth at bedtime as needed for anxiety. 20 tablet 0  . buPROPion (WELLBUTRIN SR) 150 MG 12 hr tablet Take 1 tablet (150 mg total) by mouth daily. 30 tablet 0   No facility-administered medications prior to visit.     Review of Systems  Constitutional: Negative for chills, fever, irritability, malaise/fatigue and weight loss.  HENT: Negative for ear discharge, ear pain and sore throat.   Eyes: Negative for blurred vision.  Respiratory: Negative for cough, sputum production, shortness of breath and wheezing.   Cardiovascular: Negative for chest pain,  palpitations and leg swelling.  Gastrointestinal: Negative for abdominal pain, blood in stool, constipation, diarrhea, heartburn, melena and nausea.  Genitourinary: Negative for dysuria, frequency, hematuria, impotence and urgency.  Musculoskeletal: Negative for back pain, joint pain, myalgias and neck pain.  Skin: Negative for rash.  Neurological: Negative for dizziness, tingling, sensory change, focal weakness and headaches.  Endo/Heme/Allergies: Negative for environmental allergies and polydipsia. Does not bruise/bleed easily.  Psychiatric/Behavioral: Negative for confusion, decreased concentration, depression and suicidal ideas. The patient is nervous/anxious. The patient does not have insomnia.      Objective  Vitals:   05/30/17 0908  BP: 120/80  Pulse: 72  Weight: 161 lb (73 kg)  Height: 5\' 6"  (1.676 m)    Physical Exam  Constitutional: She is well-developed, well-nourished, and in no distress. No distress.  HENT:  Head: Normocephalic and atraumatic.  Right Ear: External ear normal.  Left Ear: External ear normal.  Nose: Nose normal.  Mouth/Throat: Oropharynx is clear and moist.  Eyes: Conjunctivae and EOM are normal. Pupils are equal, round, and reactive to light. Right eye exhibits no discharge. Left eye exhibits no discharge.  Neck: Normal range of motion. Neck supple. No JVD present. No thyromegaly present.  Cardiovascular: Normal rate, regular rhythm, normal heart sounds and intact distal pulses. Exam reveals no gallop and no friction rub.  No murmur heard. Pulmonary/Chest: Effort normal and breath sounds normal. She has no wheezes. She has no rales.  Abdominal: Soft. Bowel sounds are normal. She exhibits no mass. There is no tenderness. There is no guarding.  Musculoskeletal: Normal range of motion. She exhibits no edema.  Lymphadenopathy:    She has no cervical adenopathy.  Neurological: She is alert. She has normal reflexes.  Skin: Skin is warm and dry. She is not  diaphoretic.  Psychiatric: Mood and affect normal.  Nursing note and vitals reviewed.     Assessment & Plan  Problem List Items Addressed This Visit      Cardiovascular and Mediastinum   Essential hypertension   Relevant Orders   Renal Function Panel     Endocrine   Thyroid activity decreased   Relevant Orders   TSH     Other   Nicotine dependence with current use   Relevant Medications   buPROPion (WELLBUTRIN SR) 150 MG 12 hr tablet   ALPRAZolam (XANAX) 0.25 MG tablet   Encounter for smoking cessation counseling   Relevant Medications   buPROPion (WELLBUTRIN SR) 150 MG 12 hr tablet   Anxiety - Primary   Relevant Medications   buPROPion (WELLBUTRIN SR) 150 MG 12 hr tablet   ALPRAZolam (XANAX) 0.25 MG tablet   Dyslipidemia   Relevant Orders   Lipid panel      Meds ordered this encounter  Medications  . buPROPion [facility] SR)  150 MG 12 hr tablet    Sig: Take 1 tablet (150 mg total) by mouth daily.    Dispense:  30 tablet    Refill:  0    Start out on once a day to see how she tolerates med/ spoke to [place]  . DISCONTD: ALPRAZolam (XANAX) 0.25 MG tablet    Sig: Take 1 tablet (0.25 mg total) by mouth at bedtime as needed for anxiety.    Dispense:  20 tablet    Refill:  0  . ALPRAZolam (XANAX) 0.25 MG tablet    Sig: Take 1 tablet (0.25 mg total) by mouth at bedtime as needed for anxiety.    Dispense:  30 tablet    Refill:  2      Dr. [facility]  05/30/17

## 2017-05-31 LAB — RENAL FUNCTION PANEL
Albumin: 4.9 g/dL — ABNORMAL HIGH (ref 3.6–4.8)
BUN / CREAT RATIO: 18 (ref 12–28)
BUN: 16 mg/dL (ref 8–27)
CHLORIDE: 102 mmol/L (ref 96–106)
CO2: 22 mmol/L (ref 20–29)
Calcium: 10.3 mg/dL (ref 8.7–10.3)
Creatinine, Ser: 0.9 mg/dL (ref 0.57–1.00)
GFR calc non Af Amer: 67 mL/min/{1.73_m2} (ref >59)
GFR, EST AFRICAN AMERICAN: 77 mL/min/{1.73_m2} (ref >59)
GLUCOSE: 112 mg/dL — AB (ref 65–99)
Phosphorus: 3.7 mg/dL (ref 2.5–4.5)
Potassium: 4.4 mmol/L (ref 3.5–5.2)
SODIUM: 138 mmol/L (ref 134–144)

## 2017-05-31 LAB — TSH: TSH: 1.08 u[IU]/mL (ref 0.450–4.500)

## 2017-05-31 LAB — LIPID PANEL
CHOL/HDL RATIO: 4.6 ratio — AB (ref 0.0–4.4)
Cholesterol, Total: 235 mg/dL — ABNORMAL HIGH (ref 100–199)
HDL: 51 mg/dL (ref >39)
LDL CALC: 117 mg/dL — AB (ref 0–99)
TRIGLYCERIDES: 334 mg/dL — AB (ref 0–149)
VLDL Cholesterol Cal: 67 mg/dL — ABNORMAL HIGH (ref 5–40)

## 2017-06-06 ENCOUNTER — Other Ambulatory Visit: Payer: Self-pay

## 2017-06-07 ENCOUNTER — Other Ambulatory Visit: Payer: Self-pay | Admitting: Family Medicine

## 2017-06-07 DIAGNOSIS — M5030 Other cervical disc degeneration, unspecified cervical region: Secondary | ICD-10-CM

## 2017-06-11 ENCOUNTER — Other Ambulatory Visit: Payer: Self-pay | Admitting: Family Medicine

## 2017-06-20 ENCOUNTER — Ambulatory Visit (INDEPENDENT_AMBULATORY_CARE_PROVIDER_SITE_OTHER): Payer: PRIVATE HEALTH INSURANCE | Admitting: Family Medicine

## 2017-06-20 ENCOUNTER — Encounter: Payer: Self-pay | Admitting: Family Medicine

## 2017-06-20 VITALS — BP 124/70 | HR 80 | Ht 66.0 in | Wt 161.0 lb

## 2017-06-20 DIAGNOSIS — J40 Bronchitis, not specified as acute or chronic: Secondary | ICD-10-CM

## 2017-06-20 DIAGNOSIS — J0110 Acute frontal sinusitis, unspecified: Secondary | ICD-10-CM | POA: Diagnosis not present

## 2017-06-20 MED ORDER — AMOXICILLIN 500 MG PO CAPS: 500.0000 mg | ORAL_CAPSULE | Freq: Three times a day (TID) | ORAL | 0 refills | Status: DC

## 2017-06-20 MED ORDER — GUAIFENESIN-CODEINE 100-10 MG/5ML PO SYRP: 5.0000 mL | ORAL_SOLUTION | Freq: Three times a day (TID) | ORAL | 0 refills | Status: DC | PRN

## 2017-06-20 NOTE — Progress Notes (Signed)
Name: Diane Glenn [facility]   MRN: [id number]    DOB: 1951-03-20   Date:06/20/2017       Progress Note  Subjective  Chief Complaint  Chief Complaint  Patient presents with  . Sinusitis    pressure in head, dry throat, lost voice Sat am    Sinusitis  This is a new problem. The current episode started in the past 7 days. The problem has been gradually worsening since onset. There has been no fever. Her pain is at a severity of 2/10. The pain is mild. Associated symptoms include congestion, coughing, headaches, a hoarse voice, sinus pressure, sneezing and a sore throat. Pertinent negatives include no chills, diaphoresis, ear pain, neck pain, shortness of breath or swollen glands. (Laryngitis/yellow phlegm) Past treatments include nothing. The treatment provided moderate relief.  Cough  This is a new problem. The current episode started in the past 7 days. The problem has been gradually worsening. The cough is productive of purulent sputum. Associated symptoms include headaches, nasal congestion, postnasal drip and a sore throat. Pertinent negatives include no chest pain, chills, ear pain, fever, heartburn, hemoptysis, myalgias, rash, shortness of breath, weight loss or wheezing. The symptoms are aggravated by pollens. There is no history of environmental allergies.    No problem-specific Assessment & Plan notes found for this encounter.   Past Medical History:  Diagnosis Date  . Hypertension   . Restless leg syndrome   . Thyroid disease     Past Surgical History:  Procedure Laterality Date  . BREAST BIOPSY Left 02/16/2017   left stereo path pend  . COLONOSCOPY  2013   cleared for 10 yrs- Dr Vira Agar?    Family History  Problem Relation Age of Onset  . Diabetes Father   . Stroke Father   . Breast cancer Neg Hx     Social History   Socioeconomic History  . Marital status: Married    Spouse name: Not on file  . Number of children: Not on file  . Years of education: Not on file   . Highest education level: Not on file  Social Needs  . Financial resource strain: Not on file  . Food insecurity - worry: Not on file  . Food insecurity - inability: Not on file  . Transportation needs - medical: Not on file  . Transportation needs - non-medical: Not on file  Occupational History  . Not on file  Tobacco Use  . Smoking status: Former Smoker    Packs/day: 1.00    Types: Cigarettes  . Smokeless tobacco: Never Used  Substance and Sexual Activity  . Alcohol use: Yes    Alcohol/week: 0.0 oz    Comment: seldom  . Drug use: No  . Sexual activity: Yes  Other Topics Concern  . Not on file  Social History Narrative  . Not on file    No Known Allergies  Outpatient Medications Prior to Visit  Medication Sig Dispense Refill  . ALPRAZolam (XANAX) 0.25 MG tablet Take 1 tablet (0.25 mg total) by mouth at bedtime as needed for anxiety. 30 tablet 2  . buPROPion (WELLBUTRIN SR) 150 MG 12 hr tablet Take 1 tablet (150 mg total) by mouth daily. 30 tablet 0  . buPROPion (ZYBAN) 150 MG 12 hr tablet TAKE 1 TABLET BY MOUTH EVERY DAY 30 tablet 0  . CALCIUM/MAGNESIUM/ZINC FORMULA PO Take 1 tablet by mouth daily.    . Cholecalciferol (VITAMIN D-3) 5000 units TABS Take 1 tablet by mouth daily.    Marland Kitchen  cyclobenzaprine (FLEXERIL) 10 MG tablet TAKE 1 TABLET BY MOUTH EVERYDAY AT BEDTIME 30 tablet 0  . fluticasone (FLONASE) 50 MCG/ACT nasal spray INSTILL 1-2 SPRAYS INTO EACH NOSTRIL TWICE DAILY 16 g 2  . gabapentin (NEURONTIN) 300 MG capsule Take 1 capsule by mouth 3 (three) times daily. Dr Lisette Abu    . levothyroxine (SYNTHROID, LEVOTHROID) 75 MCG tablet TAKE 1 TABLET DAILY.NEEDS APPT 90 tablet 1  . Magnesium 100 MG CAPS Take 1 capsule by mouth daily.    . metoprolol succinate (TOPROL-XL) 100 MG 24 hr tablet Take 1 tablet (100 mg total) by mouth daily. Take with or immediately following a meal. 90 tablet 1  . nicotine (NICODERM CQ) 21 mg/24hr patch Place 1 patch (21 mg total) onto the skin  daily. 28 patch 1  . Potassium 75 MG TABS Take 1 tablet by mouth daily.    Marland Kitchen rOPINIRole (REQUIP) 0.25 MG tablet Take 1 tablet (0.25 mg total) by mouth at bedtime. 90 tablet 1  . vitamin B-12 (CYANOCOBALAMIN) 1000 MCG tablet Take 1,000 mcg by mouth daily.    Hendricks Limes CONTINUING MONTH PAK 1 MG tablet TAKE AS DIRECTED 56 tablet 0   No facility-administered medications prior to visit.     Review of Systems  Constitutional: Negative for chills, diaphoresis, fever, malaise/fatigue and weight loss.  HENT: Positive for congestion, hoarse voice, postnasal drip, sinus pressure, sneezing and sore throat. Negative for ear discharge and ear pain.   Eyes: Negative for blurred vision.  Respiratory: Positive for cough. Negative for hemoptysis, sputum production, shortness of breath and wheezing.   Cardiovascular: Negative for chest pain, palpitations and leg swelling.  Gastrointestinal: Negative for abdominal pain, blood in stool, constipation, diarrhea, heartburn, melena and nausea.  Genitourinary: Negative for dysuria, frequency, hematuria and urgency.  Musculoskeletal: Negative for back pain, joint pain, myalgias and neck pain.  Skin: Negative for rash.  Neurological: Positive for headaches. Negative for dizziness, tingling, sensory change and focal weakness.  Endo/Heme/Allergies: Negative for environmental allergies and polydipsia. Does not bruise/bleed easily.  Psychiatric/Behavioral: Negative for depression and suicidal ideas. The patient is not nervous/anxious and does not have insomnia.      Objective  Vitals:   06/20/17 1006  BP: 124/70  Pulse: 80  Weight: 161 lb (73 kg)  Height: 5\' 6"  (1.676 m)    Physical Exam  Constitutional: She is well-developed, well-nourished, and in no distress. No distress.  HENT:  Head: Normocephalic and atraumatic.  Right Ear: Tympanic membrane, external ear and ear canal normal.  Left Ear: Tympanic membrane, external ear and ear canal normal.  Nose:  Mucosal edema present. Right sinus exhibits frontal sinus tenderness. Right sinus exhibits no maxillary sinus tenderness. Left sinus exhibits frontal sinus tenderness. Left sinus exhibits no maxillary sinus tenderness.  Mouth/Throat: Oropharynx is clear and moist. No oropharyngeal exudate, posterior oropharyngeal edema or posterior oropharyngeal erythema.  Eyes: Conjunctivae and EOM are normal. Pupils are equal, round, and reactive to light. Right eye exhibits no discharge. Left eye exhibits no discharge.  Neck: Normal range of motion. Neck supple. No JVD present. No thyromegaly present.  Cardiovascular: Normal rate, regular rhythm, normal heart sounds and intact distal pulses. Exam reveals no gallop and no friction rub.  No murmur heard. Pulmonary/Chest: Effort normal and breath sounds normal. She has no wheezes. She has no rales.  Abdominal: Soft. Bowel sounds are normal. She exhibits no mass. There is no tenderness. There is no guarding.  Musculoskeletal: Normal range of motion. She exhibits no edema.  Lymphadenopathy:       Head (right side): No submental and no submandibular adenopathy present.       Head (left side): No submental and no submandibular adenopathy present.    She has no cervical adenopathy.  Neurological: She is alert. She has normal reflexes.  Skin: Skin is warm and dry. She is not diaphoretic.  Psychiatric: Mood and affect normal.  Nursing note and vitals reviewed.     Assessment & Plan  Problem List Items Addressed This Visit    None    Visit Diagnoses    Acute frontal sinusitis, recurrence not specified    -  Primary   Relevant Medications   amoxicillin (AMOXIL) 500 MG capsule   guaiFENesin-codeine (ROBITUSSIN AC) 100-10 MG/5ML syrup   Bronchitis       Relevant Medications   guaiFENesin-codeine (ROBITUSSIN AC) 100-10 MG/5ML syrup      Meds ordered this encounter  Medications  . amoxicillin (AMOXIL) 500 MG capsule    Sig: Take 1 capsule (500 mg total) by  mouth 3 (three) times daily.    Dispense:  30 capsule    Refill:  0  . guaiFENesin-codeine (ROBITUSSIN AC) 100-10 MG/5ML syrup    Sig: Take 5 mLs by mouth 3 (three) times daily as needed for cough.    Dispense:  150 mL    Refill:  0      Dr. Otilio Miu [facility]  06/20/17

## 2017-06-27 ENCOUNTER — Other Ambulatory Visit: Payer: Self-pay | Admitting: Family Medicine

## 2017-06-27 DIAGNOSIS — J40 Bronchitis, not specified as acute or chronic: Secondary | ICD-10-CM

## 2017-06-28 ENCOUNTER — Other Ambulatory Visit: Payer: Self-pay | Admitting: Family Medicine

## 2017-06-28 DIAGNOSIS — J40 Bronchitis, not specified as acute or chronic: Secondary | ICD-10-CM

## 2017-07-06 ENCOUNTER — Other Ambulatory Visit: Payer: Self-pay | Admitting: Family Medicine

## 2017-07-06 DIAGNOSIS — F17210 Nicotine dependence, cigarettes, uncomplicated: Secondary | ICD-10-CM

## 2017-07-07 ENCOUNTER — Other Ambulatory Visit: Payer: Self-pay | Admitting: Family Medicine

## 2017-07-07 DIAGNOSIS — F17200 Nicotine dependence, unspecified, uncomplicated: Secondary | ICD-10-CM

## 2017-07-07 DIAGNOSIS — F419 Anxiety disorder, unspecified: Secondary | ICD-10-CM

## 2017-07-12 ENCOUNTER — Other Ambulatory Visit: Payer: Self-pay | Admitting: Family Medicine

## 2017-08-01 ENCOUNTER — Other Ambulatory Visit: Payer: Self-pay | Admitting: Family Medicine

## 2017-08-07 ENCOUNTER — Other Ambulatory Visit: Payer: Self-pay

## 2017-08-07 ENCOUNTER — Other Ambulatory Visit: Payer: Medicare Other

## 2017-08-07 DIAGNOSIS — E782 Mixed hyperlipidemia: Secondary | ICD-10-CM | POA: Diagnosis not present

## 2017-08-08 LAB — LIPID PANEL WITH LDL/HDL RATIO
CHOLESTEROL TOTAL: 225 mg/dL — AB (ref 100–199)
HDL: 47 mg/dL (ref >39)
LDL CALC: 131 mg/dL — AB (ref 0–99)
LDl/HDL Ratio: 2.8 ratio (ref 0.0–3.2)
Triglycerides: 236 mg/dL — ABNORMAL HIGH (ref 0–149)
VLDL Cholesterol Cal: 47 mg/dL — ABNORMAL HIGH (ref 5–40)

## 2017-08-08 LAB — GLUCOSE, RANDOM: Glucose: 103 mg/dL — ABNORMAL HIGH (ref 65–99)

## 2017-08-10 ENCOUNTER — Other Ambulatory Visit: Payer: Self-pay | Admitting: Family Medicine

## 2017-08-10 DIAGNOSIS — B001 Herpesviral vesicular dermatitis: Secondary | ICD-10-CM

## 2017-08-15 ENCOUNTER — Other Ambulatory Visit: Payer: Self-pay

## 2017-08-15 ENCOUNTER — Encounter: Payer: Self-pay | Admitting: Family Medicine

## 2017-08-15 ENCOUNTER — Ambulatory Visit (INDEPENDENT_AMBULATORY_CARE_PROVIDER_SITE_OTHER): Payer: PRIVATE HEALTH INSURANCE | Admitting: Family Medicine

## 2017-08-15 VITALS — BP 138/82 | HR 68 | Temp 98.2°F | Ht 66.0 in | Wt 156.0 lb

## 2017-08-15 DIAGNOSIS — J0140 Acute pansinusitis, unspecified: Secondary | ICD-10-CM

## 2017-08-15 DIAGNOSIS — F17200 Nicotine dependence, unspecified, uncomplicated: Secondary | ICD-10-CM | POA: Diagnosis not present

## 2017-08-15 MED ORDER — GUAIFENESIN-CODEINE 100-10 MG/5ML PO SYRP: 5.0000 mL | ORAL_SOLUTION | Freq: Three times a day (TID) | ORAL | 0 refills | Status: DC | PRN

## 2017-08-15 MED ORDER — AMOXICILLIN-POT CLAVULANATE 875-125 MG PO TABS: 1.0000 | ORAL_TABLET | Freq: Two times a day (BID) | ORAL | 0 refills | Status: DC

## 2017-08-15 NOTE — Progress Notes (Signed)
Name: Diane Glenn [facility]   MRN: [id number]    DOB: [birth year]   Date:08/15/2017       Progress Note  Subjective  Chief Complaint  Chief Complaint  Patient presents with  . Sinusitis    dry cough, headache, nausea, nasal drainage    Sinusitis  This is a new problem. The current episode started yesterday. The problem has been gradually worsening since onset. Maximum temperature: low grade. The pain is moderate. Associated symptoms include chills, congestion, coughing, diaphoresis, ear pain, headaches, sinus pressure and a sore throat. Pertinent negatives include no hoarse voice, neck pain, shortness of breath, sneezing or swollen glands. Past treatments include oral decongestants. The treatment provided mild relief.    No problem-specific Assessment & Plan notes found for this encounter.   Past Medical History:  Diagnosis Date  . Hypertension   . Restless leg syndrome   . Thyroid disease     Past Surgical History:  Procedure Laterality Date  . BREAST BIOPSY Left 02/16/2017   left stereo path pend  . COLONOSCOPY  2013   cleared for 10 yrs- Dr Vira Agar?    Family History  Problem Relation Age of Onset  . Diabetes Father   . Stroke Father   . Breast cancer Neg Hx     Social History   Socioeconomic History  . Marital status: Married    Spouse name: Not on file  . Number of children: Not on file  . Years of education: Not on file  . Highest education level: Not on file  Occupational History  . Not on file  Social Needs  . Financial resource strain: Not on file  . Food insecurity:    Worry: Not on file    Inability: Not on file  . Transportation needs:    Medical: Not on file    Non-medical: Not on file  Tobacco Use  . Smoking status: Former Smoker    Packs/day: 1.00    Types: Cigarettes  . Smokeless tobacco: Never Used  Substance and Sexual Activity  . Alcohol use: Yes    Alcohol/week: 0.0 oz    Comment: seldom  . Drug use: No  . Sexual activity: Yes   Lifestyle  . Physical activity:    Days per week: Not on file    Minutes per session: Not on file  . Stress: Not on file  Relationships  . Social connections:    Talks on phone: Not on file    Gets together: Not on file    Attends religious service: Not on file    Active member of club or organization: Not on file    Attends meetings of clubs or organizations: Not on file    Relationship status: Not on file  . Intimate partner violence:    Fear of current or ex partner: Not on file    Emotionally abused: Not on file    Physically abused: Not on file    Forced sexual activity: Not on file  Other Topics Concern  . Not on file  Social History Narrative  . Not on file    No Known Allergies  Outpatient Medications Prior to Visit  Medication Sig Dispense Refill  . ALPRAZolam (XANAX) 0.25 MG tablet Take 1 tablet (0.25 mg total) by mouth at bedtime as needed for anxiety. 30 tablet 2  . buPROPion (WELLBUTRIN SR) 150 MG 12 hr tablet Take 1 tablet (150 mg total) by mouth daily. 30 tablet 0  . buPROPion (ZYBAN) 150 MG  12 hr tablet TAKE 1 TABLET BY MOUTH EVERY DAY 30 tablet 0  . CALCIUM/MAGNESIUM/ZINC FORMULA PO Take 1 tablet by mouth daily.    . Cholecalciferol (VITAMIN D-3) 5000 units TABS Take 1 tablet by mouth daily.    . cyclobenzaprine (FLEXERIL) 10 MG tablet TAKE 1 TABLET BY MOUTH EVERYDAY AT BEDTIME 30 tablet 0  . fluticasone (FLONASE) 50 MCG/ACT nasal spray INSTILL 1-2 SPRAYS INTO EACH NOSTRIL TWICE DAILY 16 g 0  . gabapentin (NEURONTIN) 300 MG capsule Take 1 capsule by mouth 3 (three) times daily. Dr Lisette Abu    . levothyroxine (SYNTHROID, LEVOTHROID) 75 MCG tablet TAKE 1 TABLET DAILY.NEEDS APPT 90 tablet 1  . Magnesium 100 MG CAPS Take 1 capsule by mouth daily.    . metoprolol succinate (TOPROL-XL) 100 MG 24 hr tablet Take 1 tablet (100 mg total) by mouth daily. Take with or immediately following a meal. 90 tablet 1  . NICOTINE STEP 1 21 MG/24HR patch PLACE 1 PATCH (21 MG TOTAL)  ONTO THE SKIN DAILY. 28 patch 1  . Potassium 75 MG TABS Take 1 tablet by mouth daily.    Marland Kitchen rOPINIRole (REQUIP) 0.25 MG tablet Take 1 tablet (0.25 mg total) by mouth at bedtime. 90 tablet 1  . triamcinolone cream (KENALOG) 0.1 % APPLY TO AFFECTED AREA TWICE A DAY 30 g 0  . vitamin B-12 (CYANOCOBALAMIN) 1000 MCG tablet Take 1,000 mcg by mouth daily.    Marland Kitchen amoxicillin (AMOXIL) 500 MG capsule Take 1 capsule (500 mg total) by mouth 3 (three) times daily. 30 capsule 0  . guaiFENesin-codeine (ROBITUSSIN AC) 100-10 MG/5ML syrup Take 5 mLs by mouth 3 (three) times daily as needed for cough. 150 mL 0   No facility-administered medications prior to visit.     Review of Systems  Constitutional: Positive for chills and diaphoresis. Negative for fever, malaise/fatigue and weight loss.  HENT: Positive for congestion, ear pain, sinus pressure, sinus pain and sore throat. Negative for ear discharge, hearing loss, hoarse voice, nosebleeds and sneezing.   Eyes: Negative for blurred vision.  Respiratory: Positive for cough. Negative for hemoptysis, sputum production, shortness of breath and wheezing.   Cardiovascular: Negative for chest pain, palpitations and leg swelling.  Gastrointestinal: Negative for abdominal pain, blood in stool, constipation, diarrhea, heartburn, melena and nausea.  Genitourinary: Negative for dysuria, frequency, hematuria and urgency.  Musculoskeletal: Negative for back pain, joint pain, myalgias and neck pain.  Skin: Negative for rash.  Neurological: Positive for headaches. Negative for dizziness, tingling, sensory change and focal weakness.  Endo/Heme/Allergies: Negative for environmental allergies and polydipsia. Does not bruise/bleed easily.  Psychiatric/Behavioral: Negative for depression and suicidal ideas. The patient is not nervous/anxious and does not have insomnia.      Objective  Vitals:   08/15/17 1352  BP: 138/82  Pulse: 68  Temp: 98.2 F (36.8 C)  TempSrc: Oral   Weight: 156 lb (70.8 kg)  Height: 5\' 6"  (1.676 m)    Physical Exam  Constitutional: She is oriented to person, place, and time. She appears well-developed and well-nourished. No distress.  HENT:  Head: Normocephalic and atraumatic.  Right Ear: Hearing, tympanic membrane, external ear and ear canal normal.  Left Ear: Hearing, tympanic membrane, external ear and ear canal normal.  Nose: Right sinus exhibits maxillary sinus tenderness and frontal sinus tenderness. Left sinus exhibits maxillary sinus tenderness and frontal sinus tenderness.  Mouth/Throat: Posterior oropharyngeal erythema present. No oropharyngeal exudate or posterior oropharyngeal edema.  Eyes: Pupils are equal, round, and  reactive to light. Conjunctivae and EOM are normal. Lids are everted and swept, no foreign bodies found. Right eye exhibits no discharge. Left eye exhibits no discharge and no hordeolum. No foreign body present in the left eye. Right conjunctiva is not injected. Left conjunctiva is not injected. No scleral icterus.  Neck: Normal range of motion. Neck supple. No JVD present. No tracheal deviation present. No thyromegaly present.  Cardiovascular: Normal rate, regular rhythm, normal heart sounds and intact distal pulses. Exam reveals no gallop and no friction rub.  No murmur heard. Pulmonary/Chest: Effort normal and breath sounds normal. No respiratory distress. She has no wheezes. She has no rales.  Abdominal: Soft. Bowel sounds are normal. She exhibits no mass. There is no hepatosplenomegaly. There is no tenderness. There is no rebound and no guarding.  Musculoskeletal: Normal range of motion. She exhibits no edema or tenderness.  Lymphadenopathy:       Head (right side): Submandibular adenopathy present.       Head (left side): Submandibular adenopathy present.    She has no cervical adenopathy.  tender  Neurological: She is alert and oriented to person, place, and time. She has normal strength and normal  reflexes. She displays normal reflexes. No cranial nerve deficit.  Skin: Skin is warm and dry. No rash noted. She is not diaphoretic.  Psychiatric: She has a normal mood and affect. Her mood appears not anxious. She does not exhibit a depressed mood.  Nursing note and vitals reviewed.     Assessment & Plan  Problem List Items Addressed This Visit      Other   Nicotine dependence with current use - Primary    Other Visit Diagnoses    Acute non-recurrent pansinusitis       Relevant Medications   amoxicillin-clavulanate (AUGMENTIN) 875-125 MG tablet   guaiFENesin-codeine (ROBITUSSIN AC) 100-10 MG/5ML syrup      Meds ordered this encounter  Medications  . amoxicillin-clavulanate (AUGMENTIN) 875-125 MG tablet    Sig: Take 1 tablet by mouth 2 (two) times daily.    Dispense:  20 tablet    Refill:  0  . guaiFENesin-codeine (ROBITUSSIN AC) 100-10 MG/5ML syrup    Sig: Take 5 mLs by mouth 3 (three) times daily as needed.    Dispense:  100 mL    Refill:  0      Dr. Otilio Miu [facility]  08/15/17

## 2017-08-27 ENCOUNTER — Other Ambulatory Visit: Payer: Self-pay | Admitting: Family Medicine

## 2017-08-27 DIAGNOSIS — F17210 Nicotine dependence, cigarettes, uncomplicated: Secondary | ICD-10-CM

## 2017-08-30 ENCOUNTER — Other Ambulatory Visit: Payer: Self-pay

## 2017-08-31 ENCOUNTER — Ambulatory Visit: Admission: RE | Admit: 2017-08-31 | Payer: 59 | Source: Ambulatory Visit | Admitting: *Deleted

## 2017-08-31 ENCOUNTER — Encounter: Payer: Self-pay | Admitting: Family Medicine

## 2017-08-31 ENCOUNTER — Other Ambulatory Visit: Payer: Self-pay | Admitting: Family Medicine

## 2017-08-31 ENCOUNTER — Ambulatory Visit
Admission: RE | Admit: 2017-08-31 | Discharge: 2017-08-31 | Disposition: A | Discharge status: Home / Self Care | Payer: 59 | Source: Ambulatory Visit | Attending: Family Medicine | Admitting: Family Medicine

## 2017-08-31 ENCOUNTER — Ambulatory Visit (INDEPENDENT_AMBULATORY_CARE_PROVIDER_SITE_OTHER): Payer: PRIVATE HEALTH INSURANCE | Admitting: Family Medicine

## 2017-08-31 VITALS — BP 130/80 | HR 84 | Temp 98.6°F | Ht 66.0 in | Wt 156.0 lb

## 2017-08-31 DIAGNOSIS — J029 Acute pharyngitis, unspecified: Secondary | ICD-10-CM

## 2017-08-31 DIAGNOSIS — I700 Atherosclerosis of aorta: Secondary | ICD-10-CM | POA: Insufficient documentation

## 2017-08-31 DIAGNOSIS — J40 Bronchitis, not specified as acute or chronic: Secondary | ICD-10-CM | POA: Diagnosis not present

## 2017-08-31 DIAGNOSIS — J0101 Acute recurrent maxillary sinusitis: Secondary | ICD-10-CM | POA: Insufficient documentation

## 2017-08-31 MED ORDER — GUAIFENESIN-CODEINE 100-10 MG/5ML PO SYRP: 5.0000 mL | ORAL_SOLUTION | Freq: Three times a day (TID) | ORAL | 0 refills | Status: DC | PRN

## 2017-08-31 MED ORDER — DOXYCYCLINE HYCLATE 100 MG PO TABS: 100.0000 mg | ORAL_TABLET | Freq: Two times a day (BID) | ORAL | 0 refills | Status: DC

## 2017-08-31 NOTE — Progress Notes (Signed)
Name: Diane Glenn [facility]   MRN: [id number]    DOB: Apr 29, 1950   Date:08/31/2017       Progress Note  Subjective  Chief Complaint  Chief Complaint  Patient presents with  . Sinusitis    follow up on finishing Augmentin on 24th- not better. Has sore throat, L) earache and cong in head- no xray last time    Sinusitis  This is a recurrent problem. The current episode started in the past 7 days. The problem is unchanged. Maximum temperature: low grade. The fever has been present for less than 1 day. The pain is mild. Associated symptoms include chills, congestion, coughing, diaphoresis, ear pain, headaches, sinus pressure and a sore throat. Pertinent negatives include no hoarse voice, neck pain, shortness of breath, sneezing or swollen glands. Treatments tried: mucinex. The treatment provided mild relief.    No problem-specific Assessment & Plan notes found for this encounter.   Past Medical History:  Diagnosis Date  . Hypertension   . Restless leg syndrome   . Thyroid disease     Past Surgical History:  Procedure Laterality Date  . BREAST BIOPSY Left 02/16/2017   left stereo path pend  . COLONOSCOPY  2013   cleared for 10 yrs- Dr Vira Agar?    Family History  Problem Relation Age of Onset  . Diabetes Father   . Stroke Father   . Breast cancer Neg Hx     Social History   Socioeconomic History  . Marital status: Married    Spouse name: Not on file  . Number of children: Not on file  . Years of education: Not on file  . Highest education level: Not on file  Occupational History  . Not on file  Social Needs  . Financial resource strain: Not on file  . Food insecurity:    Worry: Not on file    Inability: Not on file  . Transportation needs:    Medical: Not on file    Non-medical: Not on file  Tobacco Use  . Smoking status: Former Smoker    Packs/day: 1.00    Types: Cigarettes  . Smokeless tobacco: Never Used  Substance and Sexual Activity  . Alcohol use: Yes   Alcohol/week: 0.0 oz    Comment: seldom  . Drug use: No  . Sexual activity: Yes  Lifestyle  . Physical activity:    Days per week: Not on file    Minutes per session: Not on file  . Stress: Not on file  Relationships  . Social connections:    Talks on phone: Not on file    Gets together: Not on file    Attends religious service: Not on file    Active member of club or organization: Not on file    Attends meetings of clubs or organizations: Not on file    Relationship status: Not on file  . Intimate partner violence:    Fear of current or ex partner: Not on file    Emotionally abused: Not on file    Physically abused: Not on file    Forced sexual activity: Not on file  Other Topics Concern  . Not on file  Social History Narrative  . Not on file    No Known Allergies  Outpatient Medications Prior to Visit  Medication Sig Dispense Refill  . ALPRAZolam (XANAX) 0.25 MG tablet Take 1 tablet (0.25 mg total) by mouth at bedtime as needed for anxiety. 30 tablet 2  . buPROPion [facility] SR) 150  MG 12 hr tablet Take 1 tablet (150 mg total) by mouth daily. 30 tablet 0  . buPROPion (ZYBAN) 150 MG 12 hr tablet TAKE 1 TABLET BY MOUTH EVERY DAY 30 tablet 0  . CALCIUM/MAGNESIUM/ZINC FORMULA PO Take 1 tablet by mouth daily.    . Cholecalciferol (VITAMIN D-3) 5000 units TABS Take 1 tablet by mouth daily.    . cyclobenzaprine (FLEXERIL) 10 MG tablet TAKE 1 TABLET BY MOUTH EVERYDAY AT BEDTIME 30 tablet 0  . fluticasone (FLONASE) 50 MCG/ACT nasal spray INSTILL 1-2 SPRAYS INTO EACH NOSTRIL TWICE DAILY 16 g 0  . gabapentin (NEURONTIN) 300 MG capsule Take 1 capsule by mouth 3 (three) times daily. Dr Lisette Abu    . levothyroxine (SYNTHROID, LEVOTHROID) 75 MCG tablet TAKE 1 TABLET DAILY.NEEDS APPT 90 tablet 1  . Magnesium 100 MG CAPS Take 1 capsule by mouth daily.    . metoprolol succinate (TOPROL-XL) 100 MG 24 hr tablet Take 1 tablet (100 mg total) by mouth daily. Take with or immediately following a  meal. 90 tablet 1  . NICOTINE STEP 1 21 MG/24HR patch PLACE 1 PATCH (21 MG TOTAL) ONTO THE SKIN DAILY. 28 patch 1  . Potassium 75 MG TABS Take 1 tablet by mouth daily.    Marland Kitchen rOPINIRole (REQUIP) 0.25 MG tablet Take 1 tablet (0.25 mg total) by mouth at bedtime. 90 tablet 1  . triamcinolone cream (KENALOG) 0.1 % APPLY TO AFFECTED AREA TWICE A DAY 30 g 0  . vitamin B-12 (CYANOCOBALAMIN) 1000 MCG tablet Take 1,000 mcg by mouth daily.    Marland Kitchen amoxicillin-clavulanate (AUGMENTIN) 875-125 MG tablet Take 1 tablet by mouth 2 (two) times daily. 20 tablet 0  . guaiFENesin-codeine (ROBITUSSIN AC) 100-10 MG/5ML syrup Take 5 mLs by mouth 3 (three) times daily as needed. 100 mL 0   No facility-administered medications prior to visit.     Review of Systems  Constitutional: Positive for chills and diaphoresis. Negative for fever, malaise/fatigue and weight loss.  HENT: Positive for congestion, ear pain, sinus pressure and sore throat. Negative for ear discharge, hoarse voice and sneezing.   Eyes: Negative for blurred vision.  Respiratory: Positive for cough. Negative for sputum production, shortness of breath and wheezing.   Cardiovascular: Negative for chest pain, palpitations and leg swelling.  Gastrointestinal: Negative for abdominal pain, blood in stool, constipation, diarrhea, heartburn, melena and nausea.  Genitourinary: Negative for dysuria, frequency, hematuria and urgency.  Musculoskeletal: Negative for back pain, joint pain, myalgias and neck pain.  Skin: Negative for rash.  Neurological: Positive for headaches. Negative for dizziness, tingling, sensory change and focal weakness.  Endo/Heme/Allergies: Negative for environmental allergies and polydipsia. Does not bruise/bleed easily.  Psychiatric/Behavioral: Negative for depression and suicidal ideas. The patient is not nervous/anxious and does not have insomnia.      Objective  Vitals:   08/31/17 1444  BP: 130/80  Pulse: 84  Temp: 98.6 F (37  C)  TempSrc: Oral  SpO2: 98%  Weight: 156 lb (70.8 kg)  Height: 5\' 6"  (1.676 m)    Physical Exam  Constitutional: No distress.  HENT:  Head: Normocephalic and atraumatic.  Right Ear: Tympanic membrane and external ear normal.  Left Ear: Tympanic membrane and external ear normal.  Nose: Right sinus exhibits maxillary sinus tenderness. Left sinus exhibits maxillary sinus tenderness.  Mouth/Throat: Posterior oropharyngeal erythema present. No oropharyngeal exudate or posterior oropharyngeal edema.  Eyes: Pupils are equal, round, and reactive to light. Conjunctivae and EOM are normal. Right eye exhibits no discharge.  Left eye exhibits no discharge.  Neck: Normal range of motion. Neck supple. No JVD present. No thyromegaly present.  Cardiovascular: Normal rate, regular rhythm, normal heart sounds and intact distal pulses. Exam reveals no gallop and no friction rub.  No murmur heard. Pulmonary/Chest: Effort normal and breath sounds normal. She has no decreased breath sounds. She has no wheezes. She has no rhonchi. She has no rales.  Abdominal: Soft. Bowel sounds are normal. She exhibits no mass. There is no tenderness. There is no guarding.  Musculoskeletal: Normal range of motion. She exhibits no edema.  Lymphadenopathy:    She has no cervical adenopathy.  Neurological: She is alert. She has normal reflexes.  Skin: Skin is warm and dry. She is not diaphoretic.  Nursing note and vitals reviewed.     Assessment & Plan  Problem List Items Addressed This Visit    None    Visit Diagnoses    Acute recurrent maxillary sinusitis    -  Primary   recurrence will evaluate with sinus views/treat with doxycycline 100 mg did   Relevant Medications   doxycycline (VIBRA-TABS) 100 MG tablet   guaiFENesin-codeine (ROBITUSSIN AC) 100-10 MG/5ML syrup   Other Relevant Orders   CBC with Differential/Platelet   DG Chest 2 View   Bronchitis       Recurrence for cough /patint partially treated    Relevant Medications   guaiFENesin-codeine (ROBITUSSIN AC) 100-10 MG/5ML syrup   Other Relevant Orders   DG Chest 2 View   Pharyngitis, unspecified etiology       patient with new onset cover with medication for sinus      Meds ordered this encounter  Medications  . doxycycline (VIBRA-TABS) 100 MG tablet    Sig: Take 1 tablet (100 mg total) by mouth 2 (two) times daily.    Dispense:  20 tablet    Refill:  0  . guaiFENesin-codeine (ROBITUSSIN AC) 100-10 MG/5ML syrup    Sig: Take 5 mLs by mouth 3 (three) times daily as needed for cough.    Dispense:  150 mL    Refill:  0      Dr. Macon [facility]  08/31/17

## 2017-09-01 ENCOUNTER — Other Ambulatory Visit: Payer: Self-pay | Admitting: Family Medicine

## 2017-09-01 ENCOUNTER — Other Ambulatory Visit: Payer: Self-pay

## 2017-09-01 DIAGNOSIS — J320 Chronic maxillary sinusitis: Secondary | ICD-10-CM

## 2017-09-01 LAB — CBC WITH DIFFERENTIAL/PLATELET
BASOS: 0 %
Basophils Absolute: 0 10*3/uL (ref 0.0–0.2)
EOS (ABSOLUTE): 0.1 10*3/uL (ref 0.0–0.4)
Eos: 2 %
HEMOGLOBIN: 13.3 g/dL (ref 11.1–15.9)
Hematocrit: 40.1 % (ref 34.0–46.6)
IMMATURE GRANS (ABS): 0 10*3/uL (ref 0.0–0.1)
IMMATURE GRANULOCYTES: 0 %
LYMPHS: 19 %
Lymphocytes Absolute: 1.8 10*3/uL (ref 0.7–3.1)
MCH: 30.6 pg (ref 26.6–33.0)
MCHC: 33.2 g/dL (ref 31.5–35.7)
MCV: 92 fL (ref 79–97)
Monocytes Absolute: 0.8 10*3/uL (ref 0.1–0.9)
Monocytes: 9 %
NEUTROS PCT: 70 %
Neutrophils Absolute: 6.6 10*3/uL (ref 1.4–7.0)
Platelets: 289 10*3/uL (ref 150–450)
RBC: 4.35 x10E6/uL (ref 3.77–5.28)
RDW: 13.6 % (ref 12.3–15.4)
WBC: 9.4 10*3/uL (ref 3.4–10.8)

## 2017-09-08 ENCOUNTER — Other Ambulatory Visit: Payer: Self-pay

## 2017-09-25 DIAGNOSIS — J342 Deviated nasal septum: Secondary | ICD-10-CM | POA: Diagnosis not present

## 2017-09-25 DIAGNOSIS — K219 Gastro-esophageal reflux disease without esophagitis: Secondary | ICD-10-CM | POA: Diagnosis not present

## 2017-09-25 DIAGNOSIS — J301 Allergic rhinitis due to pollen: Secondary | ICD-10-CM | POA: Diagnosis not present

## 2017-09-25 DIAGNOSIS — J329 Chronic sinusitis, unspecified: Secondary | ICD-10-CM | POA: Diagnosis not present

## 2017-09-26 ENCOUNTER — Other Ambulatory Visit: Payer: Self-pay | Admitting: Family Medicine

## 2017-09-26 DIAGNOSIS — M810 Age-related osteoporosis without current pathological fracture: Secondary | ICD-10-CM

## 2017-09-27 ENCOUNTER — Other Ambulatory Visit: Payer: Self-pay | Admitting: Family Medicine

## 2017-10-10 ENCOUNTER — Other Ambulatory Visit: Payer: Self-pay | Admitting: Family Medicine

## 2017-11-03 ENCOUNTER — Other Ambulatory Visit: Payer: Self-pay | Admitting: Family Medicine

## 2017-11-03 DIAGNOSIS — G2581 Restless legs syndrome: Secondary | ICD-10-CM

## 2017-11-06 ENCOUNTER — Other Ambulatory Visit: Payer: Self-pay | Admitting: Family Medicine

## 2017-11-06 DIAGNOSIS — I10 Essential (primary) hypertension: Secondary | ICD-10-CM

## 2017-11-07 ENCOUNTER — Other Ambulatory Visit: Payer: Self-pay | Admitting: Family Medicine

## 2017-12-17 ENCOUNTER — Other Ambulatory Visit: Payer: Self-pay | Admitting: Family Medicine

## 2017-12-17 DIAGNOSIS — F17200 Nicotine dependence, unspecified, uncomplicated: Secondary | ICD-10-CM

## 2017-12-17 DIAGNOSIS — B001 Herpesviral vesicular dermatitis: Secondary | ICD-10-CM

## 2017-12-17 DIAGNOSIS — F17210 Nicotine dependence, cigarettes, uncomplicated: Secondary | ICD-10-CM

## 2017-12-17 DIAGNOSIS — Z716 Tobacco abuse counseling: Secondary | ICD-10-CM

## 2017-12-19 ENCOUNTER — Encounter: Payer: Self-pay | Admitting: Family Medicine

## 2017-12-19 ENCOUNTER — Ambulatory Visit (INDEPENDENT_AMBULATORY_CARE_PROVIDER_SITE_OTHER): Payer: PRIVATE HEALTH INSURANCE | Admitting: Family Medicine

## 2017-12-19 VITALS — BP 120/70 | HR 80 | Ht 66.0 in | Wt 145.0 lb

## 2017-12-19 DIAGNOSIS — Z23 Encounter for immunization: Secondary | ICD-10-CM | POA: Diagnosis not present

## 2017-12-19 DIAGNOSIS — M810 Age-related osteoporosis without current pathological fracture: Secondary | ICD-10-CM | POA: Insufficient documentation

## 2017-12-19 DIAGNOSIS — G2581 Restless legs syndrome: Secondary | ICD-10-CM

## 2017-12-19 DIAGNOSIS — F419 Anxiety disorder, unspecified: Secondary | ICD-10-CM | POA: Diagnosis not present

## 2017-12-19 DIAGNOSIS — I10 Essential (primary) hypertension: Secondary | ICD-10-CM | POA: Diagnosis not present

## 2017-12-19 DIAGNOSIS — F329 Major depressive disorder, single episode, unspecified: Secondary | ICD-10-CM | POA: Diagnosis not present

## 2017-12-19 DIAGNOSIS — F17200 Nicotine dependence, unspecified, uncomplicated: Secondary | ICD-10-CM | POA: Diagnosis not present

## 2017-12-19 DIAGNOSIS — E039 Hypothyroidism, unspecified: Secondary | ICD-10-CM

## 2017-12-19 DIAGNOSIS — Z716 Tobacco abuse counseling: Secondary | ICD-10-CM | POA: Diagnosis not present

## 2017-12-19 DIAGNOSIS — E785 Hyperlipidemia, unspecified: Secondary | ICD-10-CM | POA: Diagnosis not present

## 2017-12-19 MED ORDER — LEVOTHYROXINE SODIUM 75 MCG PO TABS: ORAL_TABLET | ORAL | 1 refills | Status: DC

## 2017-12-19 MED ORDER — ALENDRONATE SODIUM 70 MG PO TABS: ORAL_TABLET | ORAL | 1 refills | Status: DC

## 2017-12-19 MED ORDER — METOPROLOL SUCCINATE ER 100 MG PO TB24: 100.0000 mg | ORAL_TABLET | Freq: Every day | ORAL | 1 refills | Status: DC

## 2017-12-19 MED ORDER — BUPROPION HCL ER (SR) 150 MG PO TB12: 150.0000 mg | ORAL_TABLET | Freq: Every day | ORAL | 1 refills | Status: DC

## 2017-12-19 MED ORDER — ROPINIROLE HCL 0.25 MG PO TABS: 0.2500 mg | ORAL_TABLET | Freq: Every day | ORAL | 1 refills | Status: DC

## 2017-12-19 NOTE — Assessment & Plan Note (Signed)
Episodic Partial controlled. Continue Bupropion.

## 2017-12-19 NOTE — Assessment & Plan Note (Signed)
New onset. Stable Continue bupropion SR 150mg  bid

## 2017-12-19 NOTE — Assessment & Plan Note (Signed)
Chronic Stable Continue Fosamax 70 mg weekly.

## 2017-12-19 NOTE — Assessment & Plan Note (Signed)
Chronic Will check tsh and adjust accordingly.

## 2017-12-19 NOTE — Assessment & Plan Note (Signed)
Chronic Controlled Continue Requip 0.25 mg q hs.

## 2017-12-19 NOTE — Progress Notes (Signed)
Name: Diane Glenn [facility]   MRN: [id number]    DOB: 10/06/1950   Date:12/19/2017       Progress Note  Subjective  Chief Complaint  Chief Complaint  Patient presents with  . Osteoporosis  . restless leg  . Hypothyroidism  . Hypertension  . Anxiety  . Flu Vaccine  . pneum 23    Hypertension  This is a chronic problem. The current episode started more than 1 year ago. The problem is unchanged. The problem is controlled. Associated symptoms include anxiety. Pertinent negatives include no blurred vision, chest pain, headaches, malaise/fatigue, neck pain, orthopnea, palpitations, peripheral edema, PND, shortness of breath or sweats. Risk factors for coronary artery disease include dyslipidemia. Past treatments include beta blockers. The current treatment provides moderate improvement. There are no compliance problems.  There is no history of angina, kidney disease, CAD/MI, CVA, heart failure, left ventricular hypertrophy, PVD or retinopathy. Identifiable causes of hypertension include a thyroid problem. There is no history of chronic renal disease, a hypertension causing med or renovascular disease.  Anxiety  Presents for follow-up visit. Symptoms include excessive worry, nervous/anxious behavior and panic. Patient reports no chest pain, compulsions, confusion, decreased concentration, depressed mood, dizziness, feeling of choking, hyperventilation, impotence, insomnia, irritability, malaise, nausea, obsessions, palpitations, restlessness, shortness of breath or suicidal ideas. Symptoms occur occasionally. The severity of symptoms is mild.   Her past medical history is significant for depression.  Depression       The patient presents with depression.  This is a chronic problem.  The current episode started more than 1 year ago.   The onset quality is gradual.   The problem occurs intermittently.The problem is unchanged.  Associated symptoms include no decreased concentration, no fatigue, no  helplessness, no hopelessness, does not have insomnia, not irritable, no restlessness, no decreased interest, no appetite change, no body aches, no myalgias, no headaches, no indigestion, not sad and no suicidal ideas.     The symptoms are aggravated by nothing.  Past treatments include other medications (welbutrin).  Compliance with treatment is good.  Previous treatment provided moderate relief.  Past medical history includes thyroid problem, anxiety and depression.   Thyroid Problem  Presents for follow-up visit. Symptoms include anxiety. Patient reports no cold intolerance, constipation, depressed mood, diaphoresis, diarrhea, dry skin, fatigue, hair loss, heat intolerance, hoarse voice, leg swelling, menstrual problem, nail problem, palpitations, visual change, weight gain or weight loss. The symptoms have been stable. There is no history of heart failure.  URI   This is a chronic (for allergic rhinitis) problem. The problem has been waxing and waning. Pertinent negatives include no abdominal pain, chest pain, congestion, coughing, diarrhea, dysuria, ear pain, headaches, joint pain, joint swelling, nausea, neck pain, plugged ear sensation, rash, rhinorrhea, sinus pain, sneezing, sore throat, swollen glands, vomiting or wheezing. Treatments tried: nasal steroid. The treatment provided mild relief.    Essential hypertension Chronic Controlled Continue metoprolol100 mg xl daily. Will check renal panel.  Thyroid activity decreased Chronic Will check tsh and adjust accordingly.  Anxiety New onset. Stable Continue bupropion SR 150mg  bid  Nicotine dependence with current use Not controlled on welbutrin "still sneaking" Reencouaged to quit.   Reactive depression Episodic Partial controlled. Continue Bupropion.  Restless leg syndrome Chronic Controlled Continue Requip 0.25 mg q hs.  Age related osteoporosis Chronic Stable Continue Fosamax 70 mg weekly.   Past Medical History:  Diagnosis Date   . Hypertension   . Restless leg syndrome   . Thyroid disease  Past Surgical History:  Procedure Laterality Date  . BREAST BIOPSY Left 02/16/2017   left stereo path pend  . COLONOSCOPY  2013   cleared for 10 yrs- Dr Vira Agar?    Family History  Problem Relation Age of Onset  . Diabetes Father   . Stroke Father   . Breast cancer Neg Hx     Social History   Socioeconomic History  . Marital status: Married    Spouse name: Not on file  . Number of children: Not on file  . Years of education: Not on file  . Highest education level: Not on file  Occupational History  . Not on file  Social Needs  . Financial resource strain: Not on file  . Food insecurity:    Worry: Not on file    Inability: Not on file  . Transportation needs:    Medical: Not on file    Non-medical: Not on file  Tobacco Use  . Smoking status: Former Smoker    Packs/day: 1.00    Types: Cigarettes  . Smokeless tobacco: Never Used  Substance and Sexual Activity  . Alcohol use: Yes    Alcohol/week: 0.0 standard drinks    Comment: seldom  . Drug use: No  . Sexual activity: Yes  Lifestyle  . Physical activity:    Days per week: Not on file    Minutes per session: Not on file  . Stress: Not on file  Relationships  . Social connections:    Talks on phone: Not on file    Gets together: Not on file    Attends religious service: Not on file    Active member of club or organization: Not on file    Attends meetings of clubs or organizations: Not on file    Relationship status: Not on file  . Intimate partner violence:    Fear of current or ex partner: Not on file    Emotionally abused: Not on file    Physically abused: Not on file    Forced sexual activity: Not on file  Other Topics Concern  . Not on file  Social History Narrative  . Not on file    No Known Allergies  Outpatient Medications Prior to Visit  Medication Sig Dispense Refill  . CALCIUM/MAGNESIUM/ZINC FORMULA PO Take 1 tablet by  mouth daily.    . Cholecalciferol (VITAMIN D-3) 5000 units TABS Take 1 tablet by mouth daily.    . cyclobenzaprine (FLEXERIL) 10 MG tablet TAKE 1 TABLET BY MOUTH EVERYDAY AT BEDTIME 30 tablet 0  . fluticasone (FLONASE) 50 MCG/ACT nasal spray INSTILL 1-2 SPRAYS INTO EACH NOSTRIL TWICE DAILY 16 g 11  . gabapentin (NEURONTIN) 300 MG capsule Take 1 capsule by mouth 3 (three) times daily. Dr Lisette Abu    . Magnesium 100 MG CAPS Take 1 capsule by mouth daily.    . Potassium 75 MG TABS Take 1 tablet by mouth daily.    Marland Kitchen triamcinolone cream (KENALOG) 0.1 % APPLY TO AFFECTED AREA TWICE A DAY 30 g 0  . vitamin B-12 (CYANOCOBALAMIN) 1000 MCG tablet Take 1,000 mcg by mouth daily.    Marland Kitchen alendronate (FOSAMAX) 70 MG tablet TAKE 1 TABLET BY MOUTH EVERY 7 DAYS. WITH A FULL GLASS OF WATER ON A EMPTY STOMACH 12 tablet 1  . buPROPion (WELLBUTRIN SR) 150 MG 12 hr tablet TAKE 1 TABLET BY MOUTH EVERY DAY 90 tablet 0  . levothyroxine (SYNTHROID, LEVOTHROID) 75 MCG tablet TAKE 1 TABLET DAILY.NEEDS APPT 90 tablet  1  . metoprolol succinate (TOPROL-XL) 100 MG 24 hr tablet TAKE 1 TABLET (100 MG TOTAL) BY MOUTH DAILY. TAKE WITH OR IMMEDIATELY FOLLOWING A MEAL. 90 tablet 0  . rOPINIRole (REQUIP) 0.25 MG tablet TAKE 1 TABLET (0.25 MG TOTAL) BY MOUTH AT BEDTIME. 90 tablet 0  . ALPRAZolam (XANAX) 0.25 MG tablet Take 1 tablet (0.25 mg total) by mouth at bedtime as needed for anxiety. (Patient not taking: Reported on 12/19/2017) 30 tablet 2  . nicotine (NICODERM CQ - DOSED IN MG/24 HOURS) 21 mg/24hr patch PLACE 1 PATCH (21 MG TOTAL) ONTO THE SKIN DAILY. (Patient not taking: Reported on 12/19/2017) 28 patch 1  . buPROPion (ZYBAN) 150 MG 12 hr tablet TAKE 1 TABLET BY MOUTH EVERY DAY 30 tablet 0  . doxycycline (VIBRA-TABS) 100 MG tablet Take 1 tablet (100 mg total) by mouth 2 (two) times daily. 20 tablet 0  . fluticasone (FLONASE) 50 MCG/ACT nasal spray INSTILL 1-2 SPRAYS INTO EACH NOSTRIL TWICE DAILY 16 g 1  . guaiFENesin-codeine  (ROBITUSSIN AC) 100-10 MG/5ML syrup Take 5 mLs by mouth 3 (three) times daily as needed for cough. 150 mL 0   No facility-administered medications prior to visit.     Review of Systems  Constitutional: Negative for appetite change, chills, diaphoresis, fatigue, fever, irritability, malaise/fatigue, weight gain and weight loss.  HENT: Negative for congestion, ear discharge, ear pain, hoarse voice, rhinorrhea, sinus pain, sneezing and sore throat.   Eyes: Negative for blurred vision.  Respiratory: Negative for cough, sputum production, shortness of breath and wheezing.   Cardiovascular: Negative for chest pain, palpitations, orthopnea, leg swelling and PND.  Gastrointestinal: Negative for abdominal pain, blood in stool, constipation, diarrhea, heartburn, melena, nausea and vomiting.  Genitourinary: Negative for dysuria, frequency, hematuria, impotence, menstrual problem and urgency.  Musculoskeletal: Negative for back pain, joint pain, myalgias and neck pain.  Skin: Negative for rash.  Neurological: Negative for dizziness, tingling, sensory change, focal weakness and headaches.  Endo/Heme/Allergies: Negative for environmental allergies, cold intolerance, heat intolerance and polydipsia. Does not bruise/bleed easily.  Psychiatric/Behavioral: Positive for depression. Negative for confusion, decreased concentration and suicidal ideas. The patient is nervous/anxious. The patient does not have insomnia.      Objective  Vitals:   12/19/17 1419  BP: 120/70  Pulse: 80  Weight: 145 lb (65.8 kg)  Height: 5\' 6"  (1.676 m)    Physical Exam  Constitutional: She is oriented to person, place, and time. She appears well-developed and well-nourished. She is not irritable.  HENT:  Head: Normocephalic.  Right Ear: External ear normal.  Left Ear: External ear normal.  Mouth/Throat: Oropharynx is clear and moist.  Eyes: Pupils are equal, round, and reactive to light. Conjunctivae and EOM are normal.  Lids are everted and swept, no foreign bodies found. Left eye exhibits no hordeolum. No foreign body present in the left eye. Right conjunctiva is not injected. Left conjunctiva is not injected. No scleral icterus.  Neck: Normal range of motion. Neck supple. No JVD present. No tracheal deviation present. No thyromegaly present.  Cardiovascular: Normal rate, regular rhythm, normal heart sounds and intact distal pulses. Exam reveals no gallop and no friction rub.  No murmur heard. Pulmonary/Chest: Effort normal and breath sounds normal. No respiratory distress. She has no wheezes. She has no rales.  Abdominal: Soft. Bowel sounds are normal. She exhibits no mass. There is no hepatosplenomegaly. There is no tenderness. There is no rebound and no guarding.  Musculoskeletal: Normal range of motion. She exhibits no  edema or tenderness.  Lymphadenopathy:    She has no cervical adenopathy.  Neurological: She is alert and oriented to person, place, and time. She has normal strength. She displays normal reflexes. No cranial nerve deficit.  Skin: Skin is warm. No rash noted.  Psychiatric: She has a normal mood and affect. Her mood appears not anxious. She does not exhibit a depressed mood.  Nursing note and vitals reviewed.     Assessment & Plan  Problem List Items Addressed This Visit      Cardiovascular and Mediastinum   Essential hypertension - Primary    Chronic Controlled Continue metoprolol100 mg xl daily. Will check renal panel.      Relevant Medications   metoprolol succinate (TOPROL-XL) 100 MG 24 hr tablet   Other Relevant Orders   Renal Function Panel     Endocrine   Thyroid activity decreased    Chronic Will check tsh and adjust accordingly.      Relevant Medications   levothyroxine (SYNTHROID, LEVOTHROID) 75 MCG tablet   metoprolol succinate (TOPROL-XL) 100 MG 24 hr tablet   Other Relevant Orders   TSH     Musculoskeletal and Integument   Age related osteoporosis     Chronic Stable Continue Fosamax 70 mg weekly.      Relevant Medications   alendronate (FOSAMAX) 70 MG tablet     Other   Encounter for smoking cessation counseling   Relevant Medications   buPROPion (WELLBUTRIN SR) 150 MG 12 hr tablet   Restless leg syndrome    Chronic Controlled Continue Requip 0.25 mg q hs.      Relevant Medications   rOPINIRole (REQUIP) 0.25 MG tablet   Nicotine dependence with current use    Not controlled on welbutrin "still sneaking" Reencouaged to quit.       Relevant Medications   buPROPion (WELLBUTRIN SR) 150 MG 12 hr tablet   Reactive depression    Episodic Partial controlled. Continue Bupropion.      Relevant Medications   buPROPion (WELLBUTRIN SR) 150 MG 12 hr tablet   Anxiety    New onset. Stable Continue bupropion SR 150mg  bid      Relevant Medications   buPROPion (WELLBUTRIN SR) 150 MG 12 hr tablet    Other Visit Diagnoses    Hyperlipidemia, unspecified hyperlipidemia type       Relevant Medications   metoprolol succinate (TOPROL-XL) 100 MG 24 hr tablet   Other Relevant Orders   Lipid Panel With LDL/HDL Ratio   Flu vaccine need       Discussed and administered.   Relevant Orders   Flu vaccine HIGH DOSE PF (Fluzone High dose) (Completed)   Need for pneumococcal vaccination       Discussed and administered.   Relevant Orders   Pneumococcal polysaccharide vaccine 23-valent greater than or equal to 2yo subcutaneous/IM (Completed)      Meds ordered this encounter  Medications  . alendronate (FOSAMAX) 70 MG tablet    Sig: TAKE 1 TABLET BY MOUTH EVERY 7 DAYS. WITH A FULL GLASS OF WATER ON A EMPTY STOMACH    Dispense:  12 tablet    Refill:  1  . buPROPion (WELLBUTRIN SR) 150 MG 12 hr tablet    Sig: Take 1 tablet (150 mg total) by mouth daily.    Dispense:  90 tablet    Refill:  1  . levothyroxine (SYNTHROID, LEVOTHROID) 75 MCG tablet    Sig: TAKE 1 TABLET DAILY.NEEDS APPT    Dispense:  90 tablet    Refill:  1  . metoprolol  succinate (TOPROL-XL) 100 MG 24 hr tablet    Sig: Take 1 tablet (100 mg total) by mouth daily. Take with or immediately following a meal.    Dispense:  90 tablet    Refill:  1  . rOPINIRole (REQUIP) 0.25 MG tablet    Sig: Take 1 tablet (0.25 mg total) by mouth at bedtime.    Dispense:  90 tablet    Refill:  1      Dr. Otilio Miu [facility]  12/19/17

## 2017-12-19 NOTE — Assessment & Plan Note (Signed)
Chronic Controlled Continue metoprolol100 mg xl daily. Will check renal panel.

## 2017-12-19 NOTE — Assessment & Plan Note (Signed)
Not controlled on welbutrin "still sneaking" Reencouaged to quit.

## 2017-12-20 LAB — RENAL FUNCTION PANEL
Albumin: 4.8 g/dL (ref 3.6–4.8)
BUN / CREAT RATIO: 12 (ref 12–28)
BUN: 10 mg/dL (ref 8–27)
CO2: 25 mmol/L (ref 20–29)
Calcium: 10 mg/dL (ref 8.7–10.3)
Chloride: 98 mmol/L (ref 96–106)
Creatinine, Ser: 0.85 mg/dL (ref 0.57–1.00)
GFR calc Af Amer: 83 mL/min/{1.73_m2} (ref >59)
GFR, EST NON AFRICAN AMERICAN: 72 mL/min/{1.73_m2} (ref >59)
GLUCOSE: 111 mg/dL — AB (ref 65–99)
PHOSPHORUS: 3.4 mg/dL (ref 2.5–4.5)
POTASSIUM: 4.6 mmol/L (ref 3.5–5.2)
SODIUM: 138 mmol/L (ref 134–144)

## 2017-12-20 LAB — TSH: TSH: 3.7 u[IU]/mL (ref 0.450–4.500)

## 2017-12-20 LAB — LIPID PANEL WITH LDL/HDL RATIO
Cholesterol, Total: 244 mg/dL — ABNORMAL HIGH (ref 100–199)
HDL: 62 mg/dL (ref >39)
LDL Calculated: 142 mg/dL — ABNORMAL HIGH (ref 0–99)
LDL/HDL RATIO: 2.3 ratio (ref 0.0–3.2)
TRIGLYCERIDES: 198 mg/dL — AB (ref 0–149)
VLDL Cholesterol Cal: 40 mg/dL (ref 5–40)

## 2018-01-03 ENCOUNTER — Other Ambulatory Visit: Payer: Self-pay

## 2018-01-03 DIAGNOSIS — E039 Hypothyroidism, unspecified: Secondary | ICD-10-CM

## 2018-01-03 MED ORDER — LEVOTHYROXINE SODIUM 75 MCG PO TABS: ORAL_TABLET | ORAL | 1 refills | Status: DC

## 2018-01-03 NOTE — Progress Notes (Unsigned)
Sent in levothyroxine to [facility] [place]

## 2018-01-10 ENCOUNTER — Other Ambulatory Visit: Payer: Self-pay

## 2018-01-10 DIAGNOSIS — E039 Hypothyroidism, unspecified: Secondary | ICD-10-CM

## 2018-01-10 MED ORDER — LEVOTHYROXINE SODIUM 75 MCG PO TABS: ORAL_TABLET | ORAL | 1 refills | Status: DC

## 2018-02-03 ENCOUNTER — Other Ambulatory Visit: Payer: Self-pay | Admitting: Family Medicine

## 2018-02-03 DIAGNOSIS — G2581 Restless legs syndrome: Secondary | ICD-10-CM

## 2018-03-05 ENCOUNTER — Other Ambulatory Visit: Payer: Self-pay | Admitting: Family Medicine

## 2018-03-05 DIAGNOSIS — M810 Age-related osteoporosis without current pathological fracture: Secondary | ICD-10-CM

## 2018-03-12 ENCOUNTER — Ambulatory Visit (INDEPENDENT_AMBULATORY_CARE_PROVIDER_SITE_OTHER): Payer: PRIVATE HEALTH INSURANCE | Admitting: Family Medicine

## 2018-03-12 ENCOUNTER — Encounter: Payer: Self-pay | Admitting: Family Medicine

## 2018-03-12 VITALS — BP 130/80 | HR 64 | Temp 98.4°F | Ht 66.0 in | Wt 147.0 lb

## 2018-03-12 DIAGNOSIS — F17290 Nicotine dependence, other tobacco product, uncomplicated: Secondary | ICD-10-CM | POA: Diagnosis not present

## 2018-03-12 DIAGNOSIS — R059 Cough, unspecified: Secondary | ICD-10-CM

## 2018-03-12 DIAGNOSIS — J0100 Acute maxillary sinusitis, unspecified: Secondary | ICD-10-CM

## 2018-03-12 DIAGNOSIS — R05 Cough: Secondary | ICD-10-CM | POA: Diagnosis not present

## 2018-03-12 MED ORDER — AZITHROMYCIN 250 MG PO TABS: ORAL_TABLET | ORAL | 0 refills | Status: DC

## 2018-03-12 MED ORDER — GUAIFENESIN-CODEINE 100-10 MG/5ML PO SYRP: 5.0000 mL | ORAL_SOLUTION | Freq: Three times a day (TID) | ORAL | 0 refills | Status: DC | PRN

## 2018-03-12 NOTE — Progress Notes (Signed)
Date:  03/12/2018   Name:  Diane Glenn   DOB:  1951/03/27   MRN:  [id number]  Sinusitis  This is a new problem. The current episode started 1 to 4 weeks ago (black Friday). The problem has been gradually worsening since onset. There has been no fever. The fever has been present for 3 to 4 days. The pain is mild. Associated symptoms include congestion, coughing, headaches, shortness of breath, sinus pressure and a sore throat. Pertinent negatives include no chills, diaphoresis, ear pain, hoarse voice, neck pain, sneezing or swollen glands. Past treatments include nothing. The treatment provided mild relief.   Chief Complaint: Sinusitis (cough with yellow production, headache, facial pain)    Review of Systems  Constitutional: Negative.  Negative for chills, diaphoresis, fatigue, fever and unexpected weight change.  HENT: Positive for congestion, sinus pressure and sore throat. Negative for ear discharge, ear pain, hoarse voice, rhinorrhea and sneezing.   Eyes: Negative for photophobia, pain, discharge, redness and itching.  Respiratory: Positive for cough and shortness of breath. Negative for wheezing and stridor.   Gastrointestinal: Negative for abdominal pain, blood in stool, constipation, diarrhea, nausea and vomiting.  Endocrine: Negative for cold intolerance, heat intolerance, polydipsia, polyphagia and polyuria.  Genitourinary: Negative for dysuria, flank pain, frequency, hematuria, menstrual problem, pelvic pain, urgency, vaginal bleeding and vaginal discharge.  Musculoskeletal: Negative for arthralgias, back pain, myalgias and neck pain.  Skin: Negative for rash.  Allergic/Immunologic: Negative for environmental allergies and food allergies.  Neurological: Positive for headaches. Negative for dizziness, weakness, light-headedness and numbness.  Hematological: Negative for adenopathy. Does not bruise/bleed easily.  Psychiatric/Behavioral: Negative for dysphoric mood. The  patient is not nervous/anxious.     Patient Active Problem List   Diagnosis Date Noted  . Age related osteoporosis 12/19/2017  . Encounter for smoking cessation counseling 05/30/2017  . Anxiety 05/30/2017  . Dyslipidemia 05/30/2017  . Nicotine dependence with current use 05/15/2017  . Reactive depression 05/15/2017  . Essential hypertension 01/27/2015  . Thyroid activity decreased 01/27/2015  . Restless leg syndrome 01/27/2015  . Degenerative disc disease, cervical 01/27/2015    No Known Allergies  Past Surgical History:  Procedure Laterality Date  . BREAST BIOPSY Left 02/16/2017   left stereo path pend  . COLONOSCOPY  2013   cleared for 10 yrs- Dr Vira Agar?    Social History   Tobacco Use  . Smoking status: Current Every Day Smoker    Types: E-cigarettes  . Smokeless tobacco: Never Used  Substance Use Topics  . Alcohol use: Yes    Alcohol/week: 0.0 standard drinks    Comment: seldom  . Drug use: No     Medication list has been reviewed and updated.  Current Meds  Medication Sig  . alendronate (FOSAMAX) 70 MG tablet TAKE 1 TABLET BY MOUTH EVERY 7 DAYS. WITH A FULL GLASS OF WATER ON A EMPTY STOMACH  . ALPRAZolam (XANAX) 0.25 MG tablet Take 1 tablet (0.25 mg total) by mouth at bedtime as needed for anxiety.  Marland Kitchen buPROPion (WELLBUTRIN SR) 150 MG 12 hr tablet Take 1 tablet (150 mg total) by mouth daily.  Marland Kitchen CALCIUM/MAGNESIUM/ZINC FORMULA PO Take 1 tablet by mouth daily.  . Cholecalciferol (VITAMIN D-3) 5000 units TABS Take 1 tablet by mouth daily.  . cyclobenzaprine (FLEXERIL) 10 MG tablet TAKE 1 TABLET BY MOUTH EVERYDAY AT BEDTIME  . fluticasone (FLONASE) 50 MCG/ACT nasal spray INSTILL 1-2 SPRAYS INTO EACH NOSTRIL TWICE DAILY  . gabapentin (NEURONTIN) 300 MG capsule  Take 1 capsule by mouth 3 (three) times daily. Dr Lisette Abu  . levothyroxine (SYNTHROID, LEVOTHROID) 75 MCG tablet TAKE 1 TABLET DAILY.NEEDS APPT  . Magnesium 100 MG CAPS Take 1 capsule by mouth daily.  .  metoprolol succinate (TOPROL-XL) 100 MG 24 hr tablet Take 1 tablet (100 mg total) by mouth daily. Take with or immediately following a meal.  . Potassium 75 MG TABS Take 1 tablet by mouth daily.  Marland Kitchen rOPINIRole (REQUIP) 0.25 MG tablet Take 1 tablet (0.25 mg total) by mouth at bedtime.  . triamcinolone cream (KENALOG) 0.1 % APPLY TO AFFECTED AREA TWICE A DAY  . vitamin B-12 (CYANOCOBALAMIN) 1000 MCG tablet Take 1,000 mcg by mouth daily.    PHQ 2/9 Scores 05/15/2017 10/09/2015  PHQ - 2 Score 1 0  PHQ- 9 Score 7 -    Physical Exam  Constitutional: She is oriented to person, place, and time. She appears well-developed and well-nourished.  HENT:  Head: Normocephalic.  Right Ear: External ear normal.  Left Ear: External ear normal.  Mouth/Throat: Oropharynx is clear and moist.  Eyes: Pupils are equal, round, and reactive to light. Conjunctivae and EOM are normal. Lids are everted and swept, no foreign bodies found. Left eye exhibits no hordeolum. No foreign body present in the left eye. Right conjunctiva is not injected. Left conjunctiva is not injected. No scleral icterus.  Neck: Normal range of motion. Neck supple. No JVD present. No tracheal deviation present. No thyromegaly present.  Cardiovascular: Normal rate, regular rhythm, normal heart sounds and intact distal pulses. Exam reveals no gallop and no friction rub.  No murmur heard. Pulmonary/Chest: Effort normal and breath sounds normal. No respiratory distress. She has no wheezes. She has no rales.  Abdominal: Soft. Bowel sounds are normal. She exhibits no mass. There is no hepatosplenomegaly. There is no tenderness. There is no rebound and no guarding.  Musculoskeletal: Normal range of motion. She exhibits no edema or tenderness.  Lymphadenopathy:    She has no cervical adenopathy.  Neurological: She is alert and oriented to person, place, and time. She has normal strength. She displays normal reflexes. No cranial nerve deficit.  Skin: Skin  is warm. No rash noted.  Psychiatric: She has a normal mood and affect. Her mood appears not anxious. She does not exhibit a depressed mood.  Nursing note and vitals reviewed.   BP 130/80   Pulse 64   Temp 98.4 F (36.9 C) (Oral)   Ht 5\' 6"  (1.676 m)   Wt 147 lb (66.7 kg)   BMI 23.73 kg/m   Assessment and Plan:  1. Acute maxillary sinusitis, recurrence not specified Acute, start ZPack  - azithromycin (ZITHROMAX) 250 MG tablet; 2 today then 1 a day for day  Dispense: 6 tablet; Refill: 0  2. Cough Start Robitussin AC/told to pick up Robitussin DM otc in case pharm is still out of Rob AC - guaiFENesin-codeine (ROBITUSSIN AC) 100-10 MG/5ML syrup; Take 5 mLs by mouth 3 (three) times daily as needed for cough.  Dispense: 100 mL; Refill: 0  3. Other tobacco product nicotine dependence, uncomplicated Advised to stop vaping/ discussed patches again   Dr. Otilio Miu [facility]  03/12/2018

## 2018-03-14 ENCOUNTER — Other Ambulatory Visit: Payer: Self-pay | Admitting: Family Medicine

## 2018-03-14 DIAGNOSIS — F17200 Nicotine dependence, unspecified, uncomplicated: Secondary | ICD-10-CM

## 2018-03-14 DIAGNOSIS — Z716 Tobacco abuse counseling: Secondary | ICD-10-CM

## 2018-03-20 ENCOUNTER — Telehealth: Payer: Self-pay

## 2018-03-20 DIAGNOSIS — J0100 Acute maxillary sinusitis, unspecified: Secondary | ICD-10-CM

## 2018-03-20 MED ORDER — DOXYCYCLINE HYCLATE 100 MG PO TABS: 100.0000 mg | ORAL_TABLET | Freq: Two times a day (BID) | ORAL | 0 refills | Status: DC

## 2018-03-20 NOTE — Telephone Encounter (Signed)
Pt called in stating that she is on ZPack for 8 days and is no better. Still having yellow production and congestion. Sent in Doxy 100mg  bid x 10 days to [facility] [place]

## 2018-03-26 ENCOUNTER — Ambulatory Visit
Admission: RE | Admit: 2018-03-26 | Discharge: 2018-03-26 | Disposition: A | Discharge status: Home / Self Care | Payer: 59 | Source: Ambulatory Visit | Attending: Family Medicine | Admitting: Family Medicine

## 2018-03-26 ENCOUNTER — Ambulatory Visit
Admission: RE | Admit: 2018-03-26 | Discharge: 2018-03-26 | Disposition: A | Discharge status: Home / Self Care | Payer: 59 | Attending: Family Medicine | Admitting: Family Medicine

## 2018-03-26 ENCOUNTER — Encounter: Payer: Self-pay | Admitting: Family Medicine

## 2018-03-26 ENCOUNTER — Ambulatory Visit (INDEPENDENT_AMBULATORY_CARE_PROVIDER_SITE_OTHER): Payer: PRIVATE HEALTH INSURANCE | Admitting: Family Medicine

## 2018-03-26 VITALS — BP 118/64 | HR 80 | Temp 98.3°F | Ht 66.0 in | Wt 146.0 lb

## 2018-03-26 DIAGNOSIS — J0100 Acute maxillary sinusitis, unspecified: Secondary | ICD-10-CM

## 2018-03-26 DIAGNOSIS — R059 Cough, unspecified: Secondary | ICD-10-CM

## 2018-03-26 DIAGNOSIS — J019 Acute sinusitis, unspecified: Secondary | ICD-10-CM | POA: Diagnosis not present

## 2018-03-26 DIAGNOSIS — J3489 Other specified disorders of nose and nasal sinuses: Secondary | ICD-10-CM | POA: Diagnosis not present

## 2018-03-26 DIAGNOSIS — R05 Cough: Secondary | ICD-10-CM

## 2018-03-26 DIAGNOSIS — F17200 Nicotine dependence, unspecified, uncomplicated: Secondary | ICD-10-CM

## 2018-03-26 MED ORDER — AMOXICILLIN-POT CLAVULANATE 875-125 MG PO TABS: 1.0000 | ORAL_TABLET | Freq: Two times a day (BID) | ORAL | 0 refills | Status: DC

## 2018-03-26 MED ORDER — GUAIFENESIN-CODEINE 100-10 MG/5ML PO SYRP: 5.0000 mL | ORAL_SOLUTION | Freq: Three times a day (TID) | ORAL | 0 refills | Status: DC | PRN

## 2018-03-26 NOTE — Progress Notes (Signed)
Date:  03/26/2018   Name:  Diane Glenn [facility]   DOB:  07-18-1950   MRN:  [id number]   Chief Complaint: Follow-up (had a round of [place] and is on day 6 of Doxy- clear production and cough)  Sinusitis  This is a recurrent problem. The current episode started 1 to 4 weeks ago (since Thanksgiving). There has been no fever. The fever has been present for 3 to 4 days. Her pain is at a severity of 6/10. The pain is mild. Pertinent negatives include no chills, congestion, coughing, diaphoresis, ear pain, headaches, hoarse voice, neck pain, shortness of breath, sinus pressure, sneezing, sore throat or swollen glands. Past treatments include nothing (sudafed/ flonase). The treatment provided moderate relief.    Review of Systems  Constitutional: Negative.  Negative for chills, diaphoresis, fatigue, fever and unexpected weight change.  HENT: Negative for congestion, ear discharge, ear pain, hoarse voice, rhinorrhea, sinus pressure, sneezing and sore throat.   Eyes: Negative for photophobia, pain, discharge, redness and itching.  Respiratory: Negative for cough, shortness of breath, wheezing and stridor.   Gastrointestinal: Negative for abdominal pain, blood in stool, constipation, diarrhea, nausea and vomiting.  Endocrine: Negative for cold intolerance, heat intolerance, polydipsia, polyphagia and polyuria.  Genitourinary: Negative for dysuria, flank pain, frequency, hematuria, menstrual problem, pelvic pain, urgency, vaginal bleeding and vaginal discharge.  Musculoskeletal: Negative for arthralgias, back pain, myalgias and neck pain.  Skin: Negative for rash.  Allergic/Immunologic: Negative for environmental allergies and food allergies.  Neurological: Negative for dizziness, weakness, light-headedness, numbness and headaches.  Hematological: Negative for adenopathy. Does not bruise/bleed easily.  Psychiatric/Behavioral: Negative for dysphoric mood. The patient is not nervous/anxious.      Patient Active Problem List   Diagnosis Date Noted  . Age related osteoporosis 12/19/2017  . Encounter for smoking cessation counseling 05/30/2017  . Anxiety 05/30/2017  . Dyslipidemia 05/30/2017  . Nicotine dependence with current use 05/15/2017  . Reactive depression 05/15/2017  . Essential hypertension 01/27/2015  . Thyroid activity decreased 01/27/2015  . Restless leg syndrome 01/27/2015  . Degenerative disc disease, cervical 01/27/2015    No Known Allergies  Past Surgical History:  Procedure Laterality Date  . BREAST BIOPSY Left 02/16/2017   left stereo path pend  . COLONOSCOPY  2013   cleared for 10 yrs- Dr Vira Agar?    Social History   Tobacco Use  . Smoking status: Current Every Day Smoker    Types: E-cigarettes  . Smokeless tobacco: Never Used  Substance Use Topics  . Alcohol use: Yes    Alcohol/week: 0.0 standard drinks    Comment: seldom  . Drug use: No     Medication list has been reviewed and updated.  Current Meds  Medication Sig  . alendronate (FOSAMAX) 70 MG tablet TAKE 1 TABLET BY MOUTH EVERY 7 DAYS. WITH A FULL GLASS OF WATER ON A EMPTY STOMACH  . ALPRAZolam (XANAX) 0.25 MG tablet Take 1 tablet (0.25 mg total) by mouth at bedtime as needed for anxiety.  Marland Kitchen buPROPion (WELLBUTRIN SR) 150 MG 12 hr tablet Take 1 tablet (150 mg total) by mouth daily.  Marland Kitchen buPROPion (WELLBUTRIN SR) 150 MG 12 hr tablet TAKE 1 TABLET BY MOUTH EVERY DAY  . CALCIUM/MAGNESIUM/ZINC FORMULA PO Take 1 tablet by mouth daily.  . Cholecalciferol (VITAMIN D-3) 5000 units TABS Take 1 tablet by mouth daily.  . cyclobenzaprine (FLEXERIL) 10 MG tablet TAKE 1 TABLET BY MOUTH EVERYDAY AT BEDTIME  . doxycycline (VIBRA-TABS) 100 MG tablet Take  1 tablet (100 mg total) by mouth 2 (two) times daily.  . fluticasone (FLONASE) 50 MCG/ACT nasal spray INSTILL 1-2 SPRAYS INTO EACH NOSTRIL TWICE DAILY  . gabapentin (NEURONTIN) 300 MG capsule Take 1 capsule by mouth 3 (three) times daily. Dr Lisette Abu   . guaiFENesin-codeine (ROBITUSSIN AC) 100-10 MG/5ML syrup Take 5 mLs by mouth 3 (three) times daily as needed for cough.  . levothyroxine (SYNTHROID, LEVOTHROID) 75 MCG tablet TAKE 1 TABLET DAILY.NEEDS APPT  . Magnesium 100 MG CAPS Take 1 capsule by mouth daily.  . metoprolol succinate (TOPROL-XL) 100 MG 24 hr tablet Take 1 tablet (100 mg total) by mouth daily. Take with or immediately following a meal.  . Potassium 75 MG TABS Take 1 tablet by mouth daily.  Marland Kitchen rOPINIRole (REQUIP) 0.25 MG tablet Take 1 tablet (0.25 mg total) by mouth at bedtime.  . triamcinolone cream (KENALOG) 0.1 % APPLY TO AFFECTED AREA TWICE A DAY  . vitamin B-12 (CYANOCOBALAMIN) 1000 MCG tablet Take 1,000 mcg by mouth daily.    PHQ 2/9 Scores 05/15/2017 10/09/2015  PHQ - 2 Score 1 0  PHQ- 9 Score 7 -    Physical Exam Vitals signs and nursing note reviewed.  Constitutional:      General: She is not in acute distress.    Appearance: She is not diaphoretic.  HENT:     Head: Normocephalic and atraumatic.     Right Ear: Hearing, tympanic membrane, ear canal and external ear normal.     Left Ear: Hearing, tympanic membrane, ear canal and external ear normal.     Nose: Nose normal.     Right Turbinates: Not enlarged.     Left Turbinates: Not enlarged or swollen.     Right Sinus: No maxillary sinus tenderness or frontal sinus tenderness.     Left Sinus: No maxillary sinus tenderness or frontal sinus tenderness.     Mouth/Throat:     Mouth: Mucous membranes are moist.     Pharynx: Uvula midline. No pharyngeal swelling, oropharyngeal exudate, posterior oropharyngeal erythema or uvula swelling.  Eyes:     General:        Right eye: No discharge.        Left eye: No discharge.     Conjunctiva/sclera: Conjunctivae normal.     Pupils: Pupils are equal, round, and reactive to light.  Neck:     Musculoskeletal: Normal range of motion and neck supple. No neck rigidity or muscular tenderness.     Thyroid: No thyroid mass or  thyromegaly.     Vascular: No carotid bruit or JVD.  Cardiovascular:     Rate and Rhythm: Normal rate and regular rhythm.     Heart sounds: Normal heart sounds. No murmur. No friction rub. No gallop.   Pulmonary:     Effort: Pulmonary effort is normal.     Breath sounds: Normal breath sounds. No decreased air movement. No decreased breath sounds, wheezing, rhonchi or rales.  Abdominal:     General: Bowel sounds are normal.     Palpations: Abdomen is soft. There is no mass.     Tenderness: There is no abdominal tenderness. There is no guarding.  Musculoskeletal: Normal range of motion.  Lymphadenopathy:     Cervical: No cervical adenopathy.     Right cervical: No superficial or deep cervical adenopathy.    Left cervical: No superficial or deep cervical adenopathy.  Skin:    General: Skin is warm and dry.  Neurological:  Mental Status: She is alert.     Deep Tendon Reflexes: Reflexes are normal and symmetric.     BP 118/64   Pulse 80   Temp 98.3 F (36.8 C) (Oral)   Ht 5\' 6"  (1.676 m)   Wt 146 lb (66.2 kg)   SpO2 98%   BMI 23.57 kg/m   Assessment and Plan:  1. Tobacco dependency Patient has been advised of the health risks of smoking and counseled concerning cessation of tobacco products. I spent over 3 minutes for discussion and to answer questions.  2. Cough Acute persistent continue Robitussin-AC 1 teaspoon every 6 hours. - guaiFENesin-codeine (ROBITUSSIN AC) 100-10 MG/5ML syrup; Take 5 mLs by mouth 3 (three) times daily as needed for cough.  Dispense: 100 mL; Refill: 0  3. Acute maxillary sinusitis, recurrence not specified Acute.  Environmental consultant.  Continue present scription of doxycycline 100 mg twice a day.  Obtain complete sinus x-ray evaluate for infection. - DG Sinuses Complete; Future

## 2018-03-26 NOTE — Addendum Note (Signed)
Addended by: Juline Patch on: 03/26/2018 05:20 PM   Modules accepted: Orders

## 2018-04-05 DIAGNOSIS — J329 Chronic sinusitis, unspecified: Secondary | ICD-10-CM | POA: Diagnosis not present

## 2018-04-05 DIAGNOSIS — J342 Deviated nasal septum: Secondary | ICD-10-CM | POA: Diagnosis not present

## 2018-04-05 DIAGNOSIS — J3489 Other specified disorders of nose and nasal sinuses: Secondary | ICD-10-CM | POA: Diagnosis not present

## 2018-04-17 ENCOUNTER — Other Ambulatory Visit: Payer: Self-pay | Admitting: Family Medicine

## 2018-04-17 DIAGNOSIS — B001 Herpesviral vesicular dermatitis: Secondary | ICD-10-CM

## 2018-04-24 IMAGING — US US BREAST*L* LIMITED INC AXILLA
1 series · 5 of 5 positions shown · non-contrast
Comparison: Previous exam(s).

CLINICAL DATA: Followup probable resolving hematoma and fat
necrosis in the 12 o'clock of the left breast. The patient had a
significant fall approximately 7 months ago with a large contusion
and bruising to the left breast. On 10/19/2016, the patient had
palpable fullness in the upper left breast at the location of the
previous bruise. She reports that this is less prominent today.

EXAM:
2D DIGITAL DIAGNOSTIC LEFT MAMMOGRAM WITH CAD AND ADJUNCT TOMO
ULTRASOUND LEFT BREAST

[Series 1: us breast*left* limited inc axilla · 0.06mm/px · 5 of 5 slices shown]
[im 1/5]
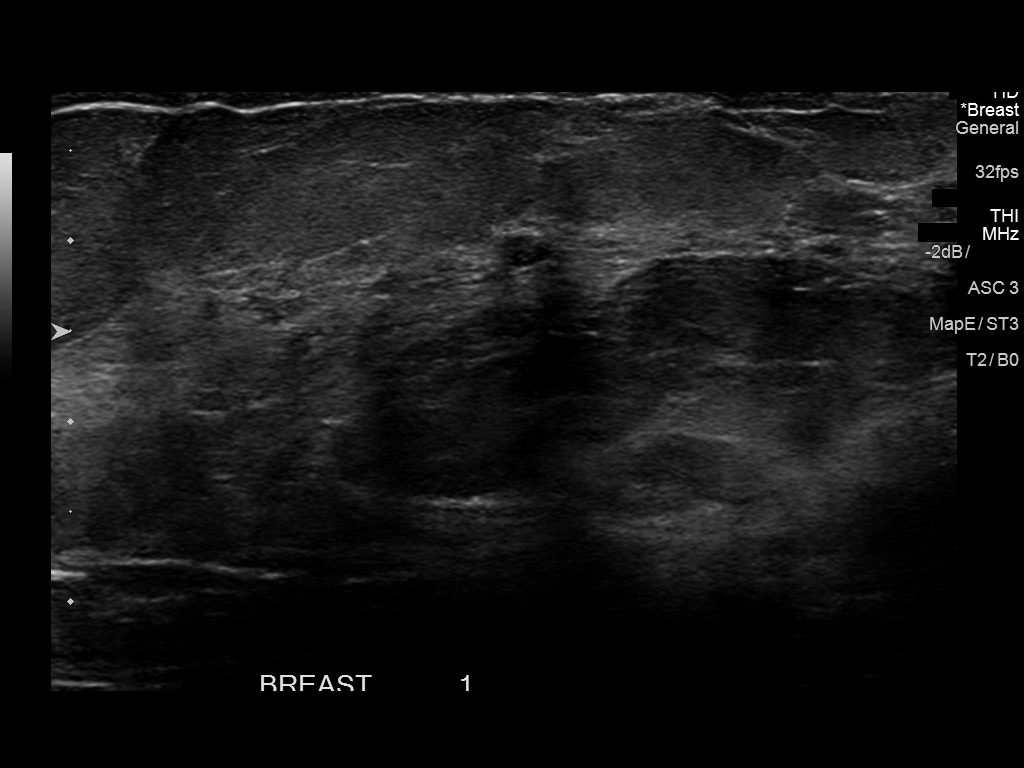
[im 2/5]
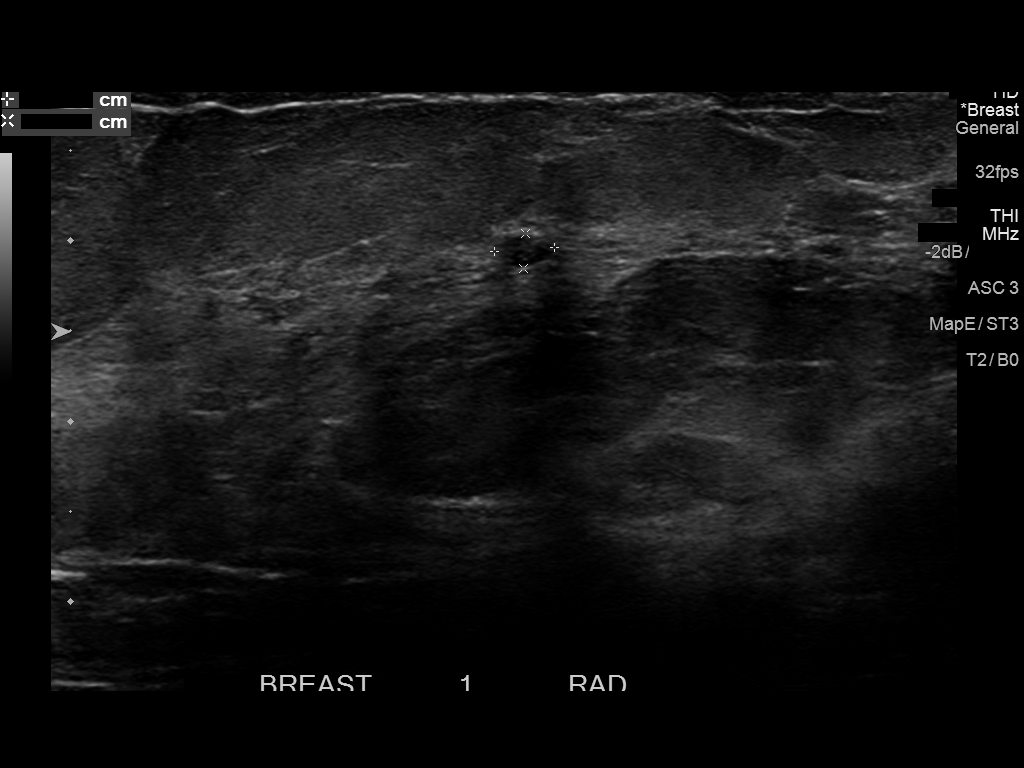
[im 3/5]
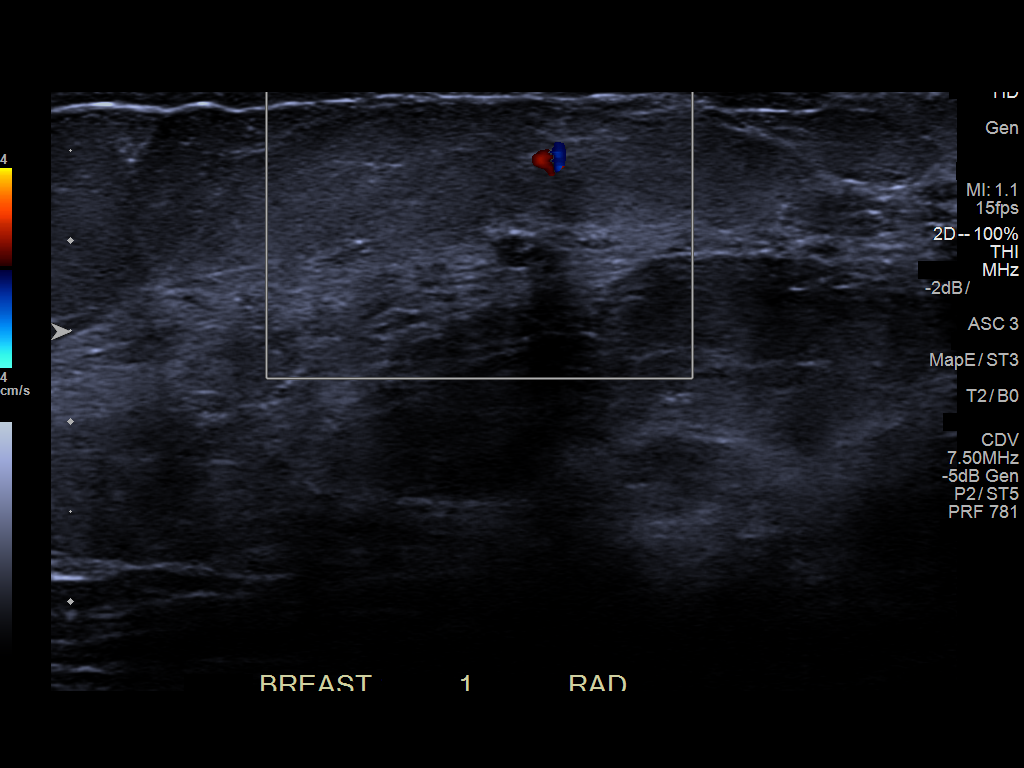
[im 4/5]
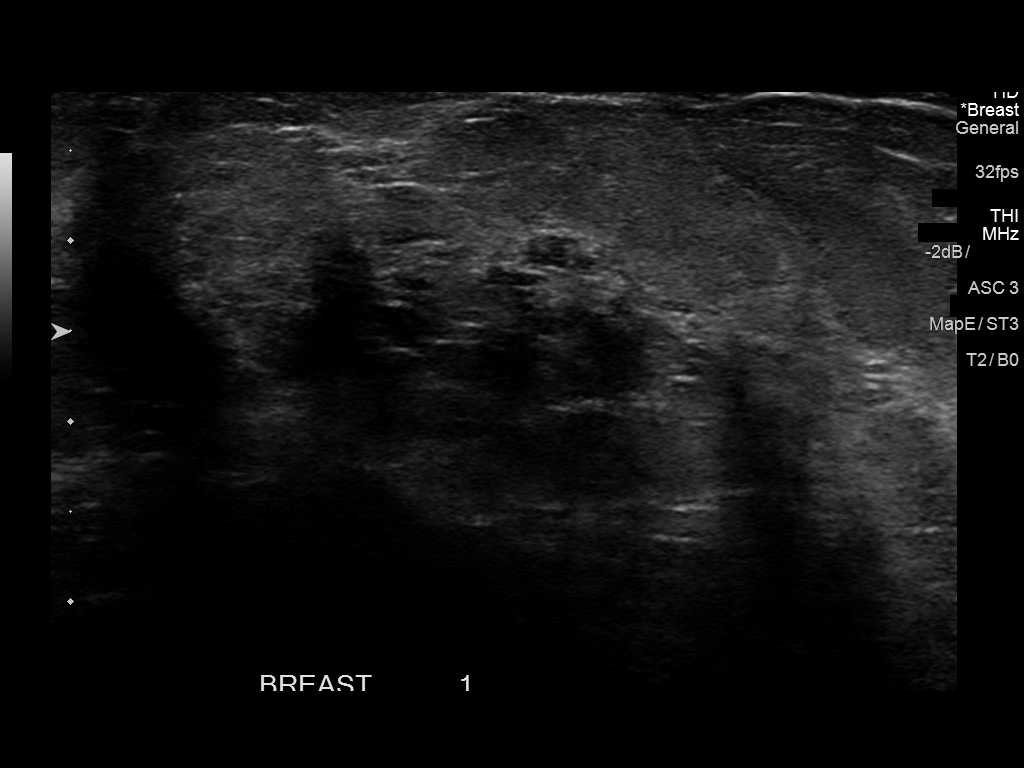
[im 5/5]
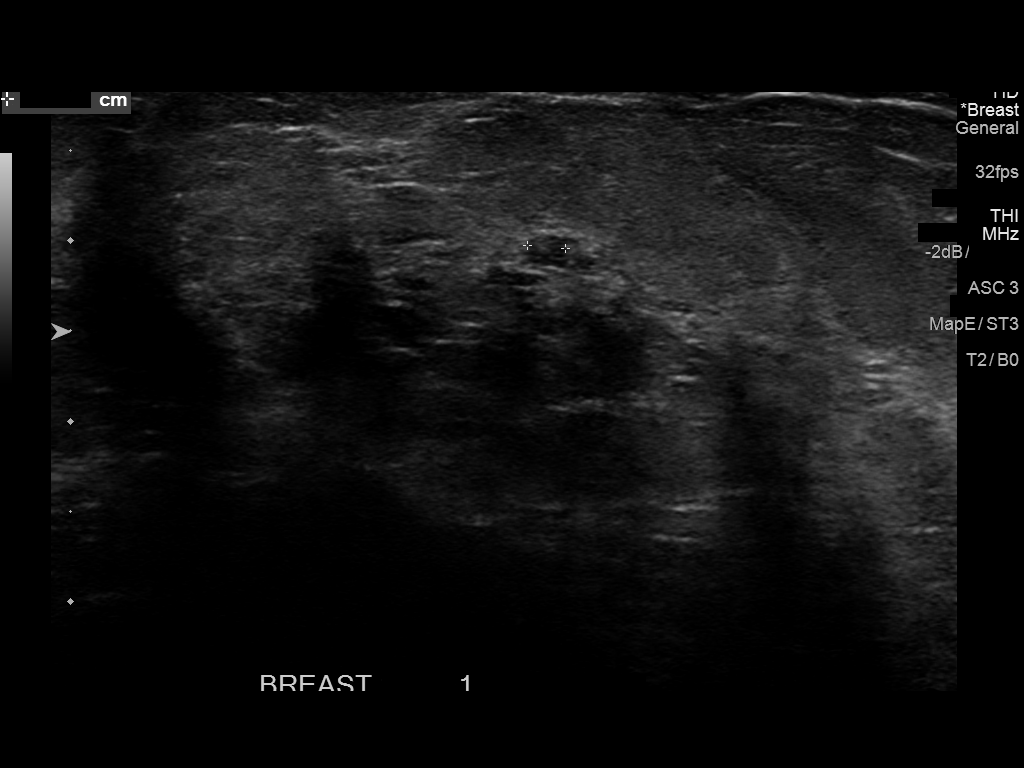

[5 of 5 positions shown; findings below may reference images not displayed]

ACR Breast Density Category c: The breast tissue is heterogeneously
dense, which may obscure small masses.
FINDINGS: The architectural distortion seen in the 12 o'clock position of the
left breast on 10/19/2016 is significantly less prominent today.
There is less discrete associated central fat density today. No
findings elsewhere in the breast suspicious for malignancy.

Mammographic images were processed with CAD.

On physical exam, no mass is palpable in the upper left breast.

Targeted ultrasound is performed, showing normal appearing breast
tissue in the area of ill-defined increased echogenicity and
shadowing seen in the 12 o'clock position of the breast, 1 cm from
the nipple, on 10/19/2016. There is a 3 x 2 x 2 mm oval hypoechoic
area within the glandular tissue in that portion of the breast. At
real time, this appears similar to other areas scattered in that
region of the breast.
IMPRESSION: Significantly improved appearance of fat necrosis and associated
distortion in the 12 o'clock position of the left breast. There is
minimal residual distortion in that region.

RECOMMENDATION:
Left diagnostic mammogram and left breast ultrasound in 6 months.
The patient expressed a desire for biopsy of the area of concern in
her left breast. Since there is no ultrasound correlate in the area
of minimal residual distortion, this will need to be performed with
3D stereotactic mammographic guidance. This will be scheduled in
consultation with her referring physician.

I have discussed the findings and recommendations with the patient.
Results were also provided in writing at the conclusion of the
visit. If applicable, a reminder letter will be sent to the patient
regarding the next appointment.

BI-RADS CATEGORY  3: Probably benign.

## 2018-05-10 ENCOUNTER — Other Ambulatory Visit: Payer: Self-pay | Admitting: Family Medicine

## 2018-05-10 DIAGNOSIS — G2581 Restless legs syndrome: Secondary | ICD-10-CM

## 2018-05-26 ENCOUNTER — Other Ambulatory Visit: Payer: Self-pay | Admitting: Family Medicine

## 2018-05-26 DIAGNOSIS — M810 Age-related osteoporosis without current pathological fracture: Secondary | ICD-10-CM

## 2018-06-12 ENCOUNTER — Other Ambulatory Visit: Payer: Self-pay | Admitting: Family Medicine

## 2018-06-12 DIAGNOSIS — F17200 Nicotine dependence, unspecified, uncomplicated: Secondary | ICD-10-CM

## 2018-06-12 DIAGNOSIS — Z716 Tobacco abuse counseling: Secondary | ICD-10-CM

## 2018-06-16 ENCOUNTER — Other Ambulatory Visit: Payer: Self-pay | Admitting: Family Medicine

## 2018-06-16 DIAGNOSIS — I10 Essential (primary) hypertension: Secondary | ICD-10-CM

## 2018-06-18 ENCOUNTER — Other Ambulatory Visit: Payer: Self-pay | Admitting: Family Medicine

## 2018-06-18 DIAGNOSIS — M810 Age-related osteoporosis without current pathological fracture: Secondary | ICD-10-CM

## 2018-06-20 ENCOUNTER — Other Ambulatory Visit: Payer: Self-pay | Admitting: Family Medicine

## 2018-06-20 DIAGNOSIS — E039 Hypothyroidism, unspecified: Secondary | ICD-10-CM

## 2018-06-25 ENCOUNTER — Encounter: Payer: Self-pay | Admitting: Family Medicine

## 2018-06-25 ENCOUNTER — Other Ambulatory Visit: Payer: Self-pay

## 2018-06-25 ENCOUNTER — Ambulatory Visit (INDEPENDENT_AMBULATORY_CARE_PROVIDER_SITE_OTHER): Payer: PRIVATE HEALTH INSURANCE | Admitting: Family Medicine

## 2018-06-25 VITALS — BP 140/80 | HR 80 | Ht 66.0 in | Wt 152.0 lb

## 2018-06-25 DIAGNOSIS — E785 Hyperlipidemia, unspecified: Secondary | ICD-10-CM

## 2018-06-25 DIAGNOSIS — M810 Age-related osteoporosis without current pathological fracture: Secondary | ICD-10-CM

## 2018-06-25 DIAGNOSIS — G2581 Restless legs syndrome: Secondary | ICD-10-CM | POA: Diagnosis not present

## 2018-06-25 DIAGNOSIS — J301 Allergic rhinitis due to pollen: Secondary | ICD-10-CM

## 2018-06-25 DIAGNOSIS — G629 Polyneuropathy, unspecified: Secondary | ICD-10-CM | POA: Diagnosis not present

## 2018-06-25 DIAGNOSIS — F329 Major depressive disorder, single episode, unspecified: Secondary | ICD-10-CM | POA: Diagnosis not present

## 2018-06-25 DIAGNOSIS — I10 Essential (primary) hypertension: Secondary | ICD-10-CM

## 2018-06-25 DIAGNOSIS — E039 Hypothyroidism, unspecified: Secondary | ICD-10-CM

## 2018-06-25 DIAGNOSIS — Z716 Tobacco abuse counseling: Secondary | ICD-10-CM

## 2018-06-25 DIAGNOSIS — F17200 Nicotine dependence, unspecified, uncomplicated: Secondary | ICD-10-CM

## 2018-06-25 MED ORDER — GABAPENTIN 300 MG PO CAPS: 300.0000 mg | ORAL_CAPSULE | Freq: Three times a day (TID) | ORAL | 1 refills | Status: DC

## 2018-06-25 MED ORDER — LEVOTHYROXINE SODIUM 75 MCG PO TABS: ORAL_TABLET | ORAL | 1 refills | Status: DC

## 2018-06-25 MED ORDER — PRAMIPEXOLE DIHYDROCHLORIDE 0.5 MG PO TABS: 0.5000 mg | ORAL_TABLET | Freq: Every day | ORAL | 1 refills | Status: DC

## 2018-06-25 MED ORDER — ALENDRONATE SODIUM 70 MG PO TABS: ORAL_TABLET | ORAL | 1 refills | Status: DC

## 2018-06-25 MED ORDER — FLUTICASONE PROPIONATE 50 MCG/ACT NA SUSP: NASAL | 11 refills | Status: DC

## 2018-06-25 MED ORDER — METOPROLOL SUCCINATE ER 100 MG PO TB24: 100.0000 mg | ORAL_TABLET | Freq: Every day | ORAL | 1 refills | Status: DC

## 2018-06-25 MED ORDER — BUPROPION HCL ER (SR) 150 MG PO TB12: 150.0000 mg | ORAL_TABLET | Freq: Every day | ORAL | 1 refills | Status: DC

## 2018-06-25 NOTE — Progress Notes (Signed)
Date:  06/25/2018   Name:  [facility]   DOB:  Aug 13, 1950   MRN:  [id number]   Chief Complaint: Hypertension; Depression (PHQ9=2); Hypothyroidism; and Osteoporosis  Patient need refills on medications. Restless legs controlled on Mirapex. Osteoporosis stable on fosamax that is intermitantly taken.  Hypertension  This is a chronic problem. The current episode started more than 1 year ago. The problem is unchanged. The problem is controlled. Pertinent negatives include no anxiety, blurred vision, chest pain, headaches, malaise/fatigue, neck pain, orthopnea, palpitations, peripheral edema, PND, shortness of breath or sweats. There are no associated agents to hypertension. Past treatments include beta blockers. The current treatment provides moderate improvement. There is no history of angina, kidney disease, CAD/MI, CVA, heart failure, left ventricular hypertrophy, PVD or retinopathy. Identifiable causes of hypertension include a thyroid problem. There is no history of chronic renal disease, a hypertension causing med or renovascular disease.  Depression       (Neuropathy)  This is a chronic problem.  The current episode started more than 1 year ago.   The onset quality is gradual.   The problem occurs intermittently.  The problem has been waxing and waning since onset.  Associated symptoms include no decreased concentration, no fatigue, no helplessness, no hopelessness, does not have insomnia, not irritable, no restlessness, no decreased interest, no appetite change, no body aches, no myalgias, no headaches, no indigestion, not sad and no suicidal ideas.( "totallt blessed")     The symptoms are aggravated by nothing.  Treatments tried: welbutrin/   Compliance with treatment is good.  Previous treatment provided mild relief.  Past medical history includes thyroid problem.     Pertinent negatives include no anxiety. Thyroid Problem  Presents for follow-up visit. Patient reports no anxiety, cold  intolerance, constipation, depressed mood, diaphoresis, diarrhea, dry skin, fatigue, hair loss, heat intolerance, hoarse voice, leg swelling, menstrual problem, nail problem, palpitations, tremors, visual change, weight gain or weight loss. The symptoms have been stable. There is no history of heart failure.  Neurologic Problem  The patient's primary symptoms include focal sensory loss. The patient's pertinent negatives include no clumsiness, focal weakness, loss of balance, memory loss, near-syncope, visual change or weakness. Primary symptoms comment: neuropathy. This is a chronic problem. The current episode started more than 1 year ago. The neurological problem developed gradually. The problem has been gradually improving since onset. There was lower extremity, left-sided and right-sided focality noted. Pertinent negatives include no abdominal pain, auditory change, aura, back pain, bladder incontinence, bowel incontinence, chest pain, confusion, diaphoresis, dizziness, fatigue, fever, headaches, light-headedness, nausea, neck pain, palpitations, shortness of breath, vertigo or vomiting. Treatments tried: gabapentin.    Review of Systems  Constitutional: Negative.  Negative for appetite change, chills, diaphoresis, fatigue, fever, malaise/fatigue, unexpected weight change, weight gain and weight loss.  HENT: Negative for congestion, ear discharge, ear pain, hoarse voice, rhinorrhea, sinus pressure, sneezing and sore throat.   Eyes: Negative for blurred vision, photophobia, pain, discharge, redness and itching.  Respiratory: Negative for cough, shortness of breath, wheezing and stridor.   Cardiovascular: Negative for chest pain, palpitations, orthopnea, PND and near-syncope.  Gastrointestinal: Negative for abdominal pain, blood in stool, bowel incontinence, constipation, diarrhea, nausea and vomiting.  Endocrine: Negative for cold intolerance, heat intolerance, polydipsia, polyphagia and polyuria.   Genitourinary: Negative for bladder incontinence, dysuria, flank pain, frequency, hematuria, menstrual problem, pelvic pain, urgency, vaginal bleeding and vaginal discharge.  Musculoskeletal: Negative for arthralgias, back pain, myalgias and neck pain.  Skin:  Negative for rash.  Allergic/Immunologic: Negative for environmental allergies and food allergies.  Neurological: Negative for dizziness, vertigo, tremors, focal weakness, weakness, light-headedness, numbness, headaches and loss of balance.  Hematological: Negative for adenopathy. Does not bruise/bleed easily.  Psychiatric/Behavioral: Positive for depression. Negative for confusion, decreased concentration, dysphoric mood, memory loss and suicidal ideas. The patient is not nervous/anxious and does not have insomnia.     Patient Active Problem List   Diagnosis Date Noted  . Age related osteoporosis 12/19/2017  . Encounter for smoking cessation counseling 05/30/2017  . Anxiety 05/30/2017  . Dyslipidemia 05/30/2017  . Nicotine dependence with current use 05/15/2017  . Reactive depression 05/15/2017  . Essential hypertension 01/27/2015  . Thyroid activity decreased 01/27/2015  . Restless leg syndrome 01/27/2015  . Degenerative disc disease, cervical 01/27/2015    No Known Allergies  Past Surgical History:  Procedure Laterality Date  . BREAST BIOPSY Left 02/16/2017   left stereo path pend  . COLONOSCOPY  2013   cleared for 10 yrs- Dr Vira Agar?    Social History   Tobacco Use  . Smoking status: Current Every Day Smoker    Types: E-cigarettes  . Smokeless tobacco: Never Used  Substance Use Topics  . Alcohol use: Yes    Alcohol/week: 0.0 standard drinks    Comment: seldom  . Drug use: No     Medication list has been reviewed and updated.  Current Meds  Medication Sig  . alendronate (FOSAMAX) 70 MG tablet TAKE 1 TABLET BY MOUTH EVERY 7 DAYS. WITH A FULL GLASS OF WATER ON A EMPTY STOMACH  . ALPRAZolam (XANAX) 0.25 MG  tablet Take 1 tablet (0.25 mg total) by mouth at bedtime as needed for anxiety.  Marland Kitchen buPROPion (WELLBUTRIN SR) 150 MG 12 hr tablet Take 1 tablet (150 mg total) by mouth daily.  Marland Kitchen CALCIUM/MAGNESIUM/ZINC FORMULA PO Take 1 tablet by mouth daily.  . Cholecalciferol (VITAMIN D-3) 5000 units TABS Take 1 tablet by mouth daily.  . cyclobenzaprine (FLEXERIL) 10 MG tablet TAKE 1 TABLET BY MOUTH EVERYDAY AT BEDTIME  . fluticasone (FLONASE) 50 MCG/ACT nasal spray INSTILL 1-2 SPRAYS INTO EACH NOSTRIL TWICE DAILY  . gabapentin (NEURONTIN) 300 MG capsule Take 1 capsule by mouth 3 (three) times daily. Dr Lisette Abu  . levothyroxine (SYNTHROID, LEVOTHROID) 75 MCG tablet TAKE 1 TABLET DAILY.NEEDS APPT  . metoprolol succinate (TOPROL-XL) 100 MG 24 hr tablet TAKE 1 TABLET (100 MG TOTAL) BY MOUTH DAILY. TAKE WITH OR IMMEDIATELY FOLLOWING A MEAL.  . pramipexole (MIRAPEX) 0.5 MG tablet Take 1 tablet by mouth daily.  Marland Kitchen triamcinolone cream (KENALOG) 0.1 % APPLY TO AFFECTED AREA TWICE A DAY  . vitamin B-12 (CYANOCOBALAMIN) 1000 MCG tablet Take 1,000 mcg by mouth daily.  . [DISCONTINUED] Magnesium 100 MG CAPS Take 1 capsule by mouth daily.  . [DISCONTINUED] rOPINIRole (REQUIP) 0.25 MG tablet TAKE 1 TABLET (0.25 MG TOTAL) BY MOUTH AT BEDTIME.    PHQ 2/9 Scores 06/25/2018 05/15/2017 10/09/2015  PHQ - 2 Score 0 1 0  PHQ- 9 Score 2 7 -    Physical Exam Vitals signs and nursing note reviewed.  Constitutional:      General: She is not irritable.She is not in acute distress.    Appearance: She is not diaphoretic.  HENT:     Head: Normocephalic and atraumatic.     Right Ear: Tympanic membrane, ear canal and external ear normal.     Left Ear: Tympanic membrane, ear canal and external ear normal.  Nose: Nose normal. No congestion or rhinorrhea.     Mouth/Throat:     Mouth: Mucous membranes are moist.  Eyes:     General:        Right eye: No discharge.        Left eye: No discharge.     Conjunctiva/sclera: Conjunctivae  normal.     Pupils: Pupils are equal, round, and reactive to light.  Neck:     Musculoskeletal: Normal range of motion and neck supple. No neck rigidity or muscular tenderness.     Thyroid: No thyromegaly.     Vascular: No carotid bruit or JVD.  Cardiovascular:     Rate and Rhythm: Normal rate and regular rhythm.     Heart sounds: Normal heart sounds. No murmur. No friction rub. No gallop.   Pulmonary:     Effort: Pulmonary effort is normal. No respiratory distress.     Breath sounds: Normal breath sounds. No stridor. No wheezing, rhonchi or rales.  Chest:     Chest wall: No tenderness.  Abdominal:     General: Abdomen is flat. Bowel sounds are normal.     Palpations: Abdomen is soft. There is no mass.     Tenderness: There is no abdominal tenderness. There is no guarding.  Musculoskeletal: Normal range of motion.  Lymphadenopathy:     Cervical: No cervical adenopathy.  Skin:    General: Skin is warm and dry.  Neurological:     Mental Status: She is alert.     Deep Tendon Reflexes: Reflexes are normal and symmetric.     Wt Readings from Last 3 Encounters:  06/25/18 152 lb (68.9 kg)  03/26/18 146 lb (66.2 kg)  03/12/18 147 lb (66.7 kg)    BP 140/80   Pulse 80   Ht 5\' 6"  (1.676 m)   Wt 152 lb (68.9 kg)   BMI 24.53 kg/m   Assessment and Plan:  1. Age related osteoporosis, unspecified pathological fracture presence Patient has age-related osteoporosis for which she is on Fosamax which she takes on a episodic basis this is been encouraged long with vitamin D. - alendronate (FOSAMAX) 70 MG tablet; Take with a full glass of water on an empty stomach.  Dispense: 12 tablet; Refill: 1  2. Encounter for smoking cessation counseling Patient continues to smoke however she has been counseled once again.Patient has been advised of the health risks of smoking and counseled concerning cessation of tobacco products. I spent over 3 minutes for discussion and to answer questions. -  buPROPion (WELLBUTRIN SR) 150 MG 12 hr tablet; Take 1 tablet (150 mg total) by mouth daily.  Dispense: 90 tablet; Refill: 1  3. Nicotine dependence with current use Patient has been advised of the health risks of smoking and counseled concerning cessation of tobacco products. I spent over 3 minutes for discussion and to answer questions. - buPROPion (WELLBUTRIN SR) 150 MG 12 hr tablet; Take 1 tablet (150 mg total) by mouth daily.  Dispense: 90 tablet; Refill: 1  4. Reactive depression Depression is controlled with Wellbutrin.  Patient would like benzodiazepine for anxiety however this was discouraged.  Will continue bupropion 150 mg every 12 hours. - buPROPion (WELLBUTRIN SR) 150 MG 12 hr tablet; Take 1 tablet (150 mg total) by mouth daily.  Dispense: 90 tablet; Refill: 1  5. Hypothyroidism, unspecified type Patient has a history of underactive thyroid.  Check thyroid panel with TSH and adjust levothyroxine currently at 75 mcg accordingly. - levothyroxine (SYNTHROID, LEVOTHROID) 75  MCG tablet; One tablet daily  Dispense: 90 tablet; Refill: 1 - Thyroid Panel With TSH - TSH  6. Essential hypertension Chronic.  Controlled.  Continue metoprolol 100 mg daily and check renal function panel. - metoprolol succinate (TOPROL-XL) 100 MG 24 hr tablet; Take 1 tablet (100 mg total) by mouth daily. Take with or immediately following a meal.  Dispense: 90 tablet; Refill: 1 - Renal Function Panel  7. Restless leg syndrome Patient with history of restless leg syndrome which is controlled adequately by Mirapex 0.5 mg nightly - pramipexole (MIRAPEX) 0.5 MG tablet; Take 1 tablet (0.5 mg total) by mouth daily.  Dispense: 90 tablet; Refill: 1  8. Dyslipidemia Chronic.  Controlled.  Continue with lipid panel and encouraged low-cholesterol diet. - Lipid Panel With LDL/HDL Ratio - Lipid panel  9. Neuropathy And has neuropathy which she takes Neurontin 300 mg 1 3 times a day. - gabapentin (NEURONTIN) 300 MG  capsule; Take 1 capsule (300 mg total) by mouth 3 (three) times daily. Dr Lisette Abu  Dispense: 270 capsule; Refill: 1  10. Seasonal allergic rhinitis due to pollen Is currently in during seasonal allergic rhinitis due to the pollen count patient will continue her Flonase 1 spray into each nostril once a day - fluticasone (FLONASE) 50 MCG/ACT nasal spray; INSTILL 1-2 SPRAYS INTO EACH NOSTRIL TWICE DAILY  Dispense: 16 g; Refill: 11

## 2018-06-26 LAB — RENAL FUNCTION PANEL
ALBUMIN: 4.3 g/dL (ref 3.8–4.8)
BUN/Creatinine Ratio: 11 — ABNORMAL LOW (ref 12–28)
BUN: 10 mg/dL (ref 8–27)
CO2: 23 mmol/L (ref 20–29)
Calcium: 9.8 mg/dL (ref 8.7–10.3)
Chloride: 98 mmol/L (ref 96–106)
Creatinine, Ser: 0.91 mg/dL (ref 0.57–1.00)
GFR calc non Af Amer: 65 mL/min/{1.73_m2} (ref >59)
GFR, EST AFRICAN AMERICAN: 76 mL/min/{1.73_m2} (ref >59)
Glucose: 93 mg/dL (ref 65–99)
Phosphorus: 3.1 mg/dL (ref 3.0–4.3)
Potassium: 5.5 mmol/L — ABNORMAL HIGH (ref 3.5–5.2)
Sodium: 138 mmol/L (ref 134–144)

## 2018-06-26 LAB — THYROID PANEL WITH TSH
Free Thyroxine Index: 1.8 (ref 1.2–4.9)
T3 Uptake Ratio: 24 % (ref 24–39)
T4, Total: 7.3 ug/dL (ref 4.5–12.0)
TSH: 1.54 u[IU]/mL (ref 0.450–4.500)

## 2018-06-26 LAB — LIPID PANEL WITH LDL/HDL RATIO
Cholesterol, Total: 278 mg/dL — ABNORMAL HIGH (ref 100–199)
HDL: 49 mg/dL (ref >39)
Triglycerides: 461 mg/dL — ABNORMAL HIGH (ref 0–149)

## 2018-06-27 ENCOUNTER — Other Ambulatory Visit: Payer: Self-pay | Admitting: Family Medicine

## 2018-06-27 DIAGNOSIS — B001 Herpesviral vesicular dermatitis: Secondary | ICD-10-CM

## 2018-06-29 ENCOUNTER — Other Ambulatory Visit: Payer: Self-pay

## 2018-06-29 ENCOUNTER — Encounter: Payer: Self-pay | Admitting: Family Medicine

## 2018-08-20 ENCOUNTER — Other Ambulatory Visit: Payer: Self-pay | Admitting: Family Medicine

## 2018-08-20 DIAGNOSIS — G2581 Restless legs syndrome: Secondary | ICD-10-CM

## 2018-09-27 ENCOUNTER — Other Ambulatory Visit: Payer: Self-pay | Admitting: Family Medicine

## 2018-09-27 DIAGNOSIS — B001 Herpesviral vesicular dermatitis: Secondary | ICD-10-CM

## 2018-11-14 ENCOUNTER — Other Ambulatory Visit: Payer: Self-pay | Admitting: Family Medicine

## 2018-11-14 DIAGNOSIS — G2581 Restless legs syndrome: Secondary | ICD-10-CM

## 2018-12-23 ENCOUNTER — Other Ambulatory Visit: Payer: Self-pay | Admitting: Family Medicine

## 2018-12-23 DIAGNOSIS — G629 Polyneuropathy, unspecified: Secondary | ICD-10-CM

## 2018-12-23 DIAGNOSIS — G2581 Restless legs syndrome: Secondary | ICD-10-CM

## 2018-12-26 ENCOUNTER — Encounter: Payer: Self-pay | Admitting: Family Medicine

## 2018-12-26 ENCOUNTER — Emergency Department
Admission: EM | Admit: 2018-12-26 | Discharge: 2018-12-26 | Disposition: A | Discharge status: Home / Self Care | Payer: 59 | Attending: Emergency Medicine | Admitting: Emergency Medicine

## 2018-12-26 ENCOUNTER — Emergency Department: Payer: 59

## 2018-12-26 ENCOUNTER — Other Ambulatory Visit: Payer: Self-pay

## 2018-12-26 ENCOUNTER — Ambulatory Visit (INDEPENDENT_AMBULATORY_CARE_PROVIDER_SITE_OTHER): Payer: PRIVATE HEALTH INSURANCE | Admitting: Family Medicine

## 2018-12-26 VITALS — BP 130/80 | HR 64 | Ht 66.0 in | Wt 165.0 lb

## 2018-12-26 DIAGNOSIS — R079 Chest pain, unspecified: Secondary | ICD-10-CM | POA: Insufficient documentation

## 2018-12-26 DIAGNOSIS — Z716 Tobacco abuse counseling: Secondary | ICD-10-CM | POA: Diagnosis not present

## 2018-12-26 DIAGNOSIS — Z23 Encounter for immunization: Secondary | ICD-10-CM | POA: Diagnosis not present

## 2018-12-26 DIAGNOSIS — Z79899 Other long term (current) drug therapy: Secondary | ICD-10-CM | POA: Insufficient documentation

## 2018-12-26 DIAGNOSIS — F17200 Nicotine dependence, unspecified, uncomplicated: Secondary | ICD-10-CM | POA: Diagnosis not present

## 2018-12-26 DIAGNOSIS — F329 Major depressive disorder, single episode, unspecified: Secondary | ICD-10-CM

## 2018-12-26 DIAGNOSIS — G2581 Restless legs syndrome: Secondary | ICD-10-CM | POA: Diagnosis not present

## 2018-12-26 DIAGNOSIS — M810 Age-related osteoporosis without current pathological fracture: Secondary | ICD-10-CM

## 2018-12-26 DIAGNOSIS — I10 Essential (primary) hypertension: Secondary | ICD-10-CM

## 2018-12-26 DIAGNOSIS — E039 Hypothyroidism, unspecified: Secondary | ICD-10-CM | POA: Diagnosis not present

## 2018-12-26 DIAGNOSIS — F17290 Nicotine dependence, other tobacco product, uncomplicated: Secondary | ICD-10-CM | POA: Diagnosis not present

## 2018-12-26 LAB — CBC
HCT: 37.4 % (ref 36.0–46.0)
Hemoglobin: 12.5 g/dL (ref 12.0–15.0)
MCH: 31.3 pg (ref 26.0–34.0)
MCHC: 33.4 g/dL (ref 30.0–36.0)
MCV: 93.7 fL (ref 80.0–100.0)
Platelets: 271 10*3/uL (ref 150–400)
RBC: 3.99 MIL/uL (ref 3.87–5.11)
RDW: 12.5 % (ref 11.5–15.5)
WBC: 5.7 10*3/uL (ref 4.0–10.5)
nRBC: 0 % (ref 0.0–0.2)

## 2018-12-26 LAB — BASIC METABOLIC PANEL
Anion gap: 10 (ref 5–15)
BUN: 10 mg/dL (ref 8–23)
CO2: 24 mmol/L (ref 22–32)
Calcium: 9.4 mg/dL (ref 8.9–10.3)
Chloride: 103 mmol/L (ref 98–111)
Creatinine, Ser: 0.92 mg/dL (ref 0.44–1.00)
GFR calc Af Amer: >60 mL/min (ref >60)
GFR calc non Af Amer: >60 mL/min (ref >60)
Glucose, Bld: 109 mg/dL — ABNORMAL HIGH (ref 70–99)
Potassium: 3.6 mmol/L (ref 3.5–5.1)
Sodium: 137 mmol/L (ref 135–145)

## 2018-12-26 LAB — TROPONIN I (HIGH SENSITIVITY)
Troponin I (High Sensitivity): 7 ng/L (ref <18)
Troponin I (High Sensitivity): 12 ng/L (ref <18)

## 2018-12-26 MED ORDER — LEVOTHYROXINE SODIUM 75 MCG PO TABS: ORAL_TABLET | ORAL | 1 refills | Status: DC

## 2018-12-26 MED ORDER — BUPROPION HCL ER (SR) 150 MG PO TB12: 150.0000 mg | ORAL_TABLET | Freq: Every day | ORAL | 1 refills | Status: DC

## 2018-12-26 MED ORDER — PRAMIPEXOLE DIHYDROCHLORIDE 0.5 MG PO TABS: 0.5000 mg | ORAL_TABLET | Freq: Every day | ORAL | 1 refills | Status: DC

## 2018-12-26 MED ORDER — METOPROLOL SUCCINATE ER 100 MG PO TB24: 100.0000 mg | ORAL_TABLET | Freq: Every day | ORAL | 1 refills | Status: DC

## 2018-12-26 NOTE — Discharge Instructions (Addendum)
Your work-up was reassuring.  Your cardiac markers were negative.  Given your risk factors for heart disease you should follow-up with cardiology outpatient.  Return to the ER for worsening chest pain or any other concerns.

## 2018-12-26 NOTE — Addendum Note (Signed)
Addended by: Juline Patch on: 12/26/2018 02:11 PM   Modules accepted: Orders

## 2018-12-26 NOTE — ED Provider Notes (Signed)
[facility] Emergency Department Provider Note  ____________________________________________   First MD Initiated Contact with Patient 12/26/18 1838     (approximate)  I have reviewed the triage vital signs and the nursing notes.   HISTORY  Chief Complaint Chest Pain    HPI Diane Glenn is a 68 y.o. female with hypertension, thyroid disease who presents with chest pain.  Patient had nonexertional chest pain that started today around 315 that was a sharp stabbing sensation into the middle of her chest that lasted few minutes and then went away on its own, nothing seemed to brought it on, nothing that seem to make it better.  She denies any shortness of breath cough fever, unilateral leg swelling or any other concerns.  Denies any abdominal pain.  She is currently pain-free.  Her husband has had a history of heart attack so they came here to have it evaluated.     Past Medical History:  Diagnosis Date   Hypertension    Restless leg syndrome    Thyroid disease     Patient Active Problem List   Diagnosis Date Noted   Neuropathy 06/25/2018   Seasonal allergic rhinitis due to pollen 06/25/2018   Age related osteoporosis 12/19/2017   Encounter for smoking cessation counseling 05/30/2017   Anxiety 05/30/2017   Dyslipidemia 05/30/2017   Nicotine dependence with current use 05/15/2017   Reactive depression 05/15/2017   Essential hypertension 01/27/2015   Thyroid activity decreased 01/27/2015   Restless leg syndrome 01/27/2015   Degenerative disc disease, cervical 01/27/2015    Past Surgical History:  Procedure Laterality Date   BREAST BIOPSY Left 02/16/2017   left stereo path pend   COLONOSCOPY  2013   cleared for 10 yrs- Dr Vira Agar?    Prior to Admission medications   Medication Sig Start Date End Date Taking? Authorizing Provider  buPROPion (WELLBUTRIN SR) 150 MG 12 hr tablet Take 1 tablet (150 mg total) by mouth daily.  12/26/18   Juline Patch, MD  CALCIUM/MAGNESIUM/ZINC FORMULA PO Take 1 tablet by mouth daily.    [provider]  Cholecalciferol (VITAMIN D-3) 5000 units TABS Take 1 tablet by mouth daily.    [provider]  cyclobenzaprine (FLEXERIL) 10 MG tablet TAKE 1 TABLET BY MOUTH EVERYDAY AT BEDTIME 06/08/17   Juline Patch, MD  fluticasone (FLONASE) 50 MCG/ACT nasal spray INSTILL 1-2 SPRAYS INTO EACH NOSTRIL TWICE DAILY 06/25/18   Juline Patch, MD  gabapentin (NEURONTIN) 300 MG capsule TAKE 1 CAPSULE (300 MG TOTAL) BY MOUTH 3 (THREE) TIMES DAILY. Patient taking differently: [facility] 12/24/18   Juline Patch, MD  levothyroxine (SYNTHROID) 75 MCG tablet One tablet daily 12/26/18   Juline Patch, MD  metoprolol succinate (TOPROL-XL) 100 MG 24 hr tablet Take 1 tablet (100 mg total) by mouth daily. Take with or immediately following a meal. 12/26/18   Juline Patch, MD  pramipexole (MIRAPEX) 0.5 MG tablet Take 1 tablet (0.5 mg total) by mouth daily. 12/26/18   Juline Patch, MD  triamcinolone cream (KENALOG) 0.1 % APPLY TO AFFECTED AREA TWICE A DAY 09/28/18   Juline Patch, MD    Allergies Patient has no known allergies.  Family History  Problem Relation Age of Onset   Diabetes Father    Stroke Father    Breast cancer Neg Hx     Social History Social History   Tobacco Use   Smoking status: Current Every Day Smoker  Types: E-cigarettes   Smokeless tobacco: Never Used  Substance Use Topics   Alcohol use: Yes    Alcohol/week: 0.0 standard drinks    Comment: seldom   Drug use: No      Review of Systems Constitutional: No fever/chills Eyes: No visual changes. ENT: No sore throat. Cardiovascular: Positive chest pain Respiratory: Denies shortness of breath. Gastrointestinal: No abdominal pain.  No nausea, no vomiting.  No diarrhea.  No constipation. Genitourinary: Negative for dysuria. Musculoskeletal: Negative for back pain. Skin: Negative for  rash. Neurological: Negative for headaches, focal weakness or numbness. All other ROS negative ____________________________________________   PHYSICAL EXAM:  VITAL SIGNS: ED Triage Vitals  Enc Vitals Group     BP 12/26/18 1538 (!) 169/95     Pulse Rate 12/26/18 1538 72     Resp 12/26/18 1538 18     Temp 12/26/18 1538 97.7 F (36.5 C)     Temp Source 12/26/18 1538 Oral     SpO2 12/26/18 1538 99 %     Weight 12/26/18 1539 166 lb (75.3 kg)     Height 12/26/18 1539 5\' 6"  (1.676 m)     Head Circumference --      Peak Flow --      Pain Score 12/26/18 1539 0     Pain Loc --      Pain Edu? --      Excl. in [place]? --     Constitutional: Alert and oriented. Well appearing and in no acute distress. Eyes: Conjunctivae are normal. EOMI. Head: Atraumatic. Nose: No congestion/rhinnorhea. Mouth/Throat: Mucous membranes are moist.   Neck: No stridor. Trachea Midline. FROM Cardiovascular: Normal rate, regular rhythm. Grossly normal heart sounds.  Good peripheral circulation. Respiratory: Normal respiratory effort.  No retractions. Lungs CTAB. Gastrointestinal: Soft and nontender. No distention. No abdominal bruits.  Musculoskeletal: No lower extremity tenderness nor edema.  No joint effusions. Neurologic:  Normal speech and language. No gross focal neurologic deficits are appreciated.  Skin:  Skin is warm, dry and intact. No rash noted. Psychiatric: Mood and affect are normal. Speech and behavior are normal. GU: Deferred   ____________________________________________   LABS (all labs ordered are listed, but only abnormal results are displayed)  Labs Reviewed  BASIC METABOLIC PANEL - Abnormal; Notable for the following components:      Result Value   Glucose, Bld 109 (*)    All other components within normal limits  CBC  TROPONIN I (HIGH SENSITIVITY)  TROPONIN I (HIGH SENSITIVITY)   ____________________________________________   ED ECG REPORT I, Vanessa Rosebud, the attending  physician, personally viewed and interpreted this ECG.  EKG is normal sinus rate of 76, no ST elevation, no T wave version, normal intervals ____________________________________________  RADIOLOGY Robert Bellow, personally viewed and evaluated these images (plain radiographs) as part of my medical decision making, as well as reviewing the written report by the radiologist.  ED MD interpretation:  No PNA  Official radiology report(s): Dg Chest 2 View  Result Date: 12/26/2018 CLINICAL DATA:  Acute onset of chest pain. EXAM: CHEST - 2 VIEW COMPARISON:  08/31/2017 FINDINGS: The cardiac silhouette, mediastinal and hilar contours are within normal limits and stable. The lungs are clear of an acute process. Stable calcified granuloma noted in the left lower lobe. No infiltrates, edema or effusions. No worrisome pulmonary lesions. The bony thorax is intact. IMPRESSION: No acute cardiopulmonary findings. Electronically Signed   By: Marijo Sanes M.D.   On: 12/26/2018 16:37  ____________________________________________   PROCEDURES  Procedure(s) performed (including Critical Care):  Procedures   ____________________________________________   INITIAL IMPRESSION / ASSESSMENT AND PLAN / ED COURSE   Suma Roccaforte Utter was evaluated in Emergency Department on 12/26/2018 for the symptoms described in the history of present illness. She was evaluated in the context of the global COVID-19 pandemic, which necessitated consideration that the patient might be at risk for infection with the SARS-CoV-2 virus that causes COVID-19. Institutional protocols and algorithms that pertain to the evaluation of patients at risk for COVID-19 are in a state of rapid change based on information released by regulatory bodies including the CDC and federal and state organizations. These policies and algorithms were followed during the patient's care in the ED.    Patient is very well-appearing and is chest pain-free.   She is slightly hypertensive but missed her medications.  Most Likely DDx:  -MSK (atypical chest pain) but will get cardiac markers to evaluate for ACS given risk factors/age  DDx that was also considered d/t potential to cause harm, but was found less likely based on history and physical (as detailed above): -PNA (no fevers, cough but CXR to evaluate) -PNX (reassured with equal b/l breath sounds, CXR to evaluate) -Symptomatic anemia (will get H&H) -Pulmonary embolism as no sob at rest, not pleuritic in nature, no hypoxia -Aortic Dissection as no tearing pain and no radiation to the mid back, pulses equal -Pericarditis no rub on exam, EKG changes or hx to suggest dx -Tamponade (no notable SOB, tachycardic, hypotensive) -Esophageal rupture (no h/o diffuse vomitting/no crepitus)  Troponin went from 7-12 which rules her out for ACS.  No evidence of anemia.  No evidence of infection.  Discussed with patient that her blood pressure was elevated.  Patient missed her blood pressure medicine this morning because of her appointment with her primary care doctor.  She understood that she had to have this repeated.  We have patient cardiology follow-up given she does have some risk factors for heart disease.  I discussed the provisional nature of ED diagnosis, the treatment so far, the ongoing plan of care, follow up appointments and return precautions with the patient and any family or support people present. They expressed understanding and agreed with the plan, discharged home.    ____________________________________________   FINAL CLINICAL IMPRESSION(S) / ED DIAGNOSES   Final diagnoses:  Chest pain in adult     MEDICATIONS GIVEN DURING THIS VISIT:  Medications - No data to display   ED Discharge Orders    None       Note:  This document was prepared using Dragon voice recognition software and may include unintentional dictation errors.   Vanessa [place], MD 12/26/18 [id number]

## 2018-12-26 NOTE — Progress Notes (Addendum)
Date:  12/26/2018   Name:  Diane Glenn [facility]   DOB:  10/11/1950   MRN:  [id number]   Chief Complaint: Osteoporosis, Allergic Rhinitis , Depression (PHQ9=2 and GAD7=4), Hypothyroidism, Hypertension, restless leg, and influenza vacc need   Review of Systems  Constitutional: Negative for appetite change, diaphoresis, fatigue, weight gain and weight loss.  HENT: Negative for hoarse voice.   Eyes: Negative for blurred vision.  Respiratory: Negative for shortness of breath.   Cardiovascular: Negative for chest pain, palpitations, orthopnea, PND and near-syncope.  Gastrointestinal: Negative for abdominal pain, constipation, diarrhea and nausea.  Endocrine: Negative for cold intolerance and heat intolerance.  Genitourinary: Negative for menstrual problem.  Musculoskeletal: Negative for myalgias and neck pain.  Neurological: Negative for tremors, focal weakness, syncope, weakness, headaches and loss of balance.  Psychiatric/Behavioral: Positive for depression. Negative for confusion, decreased concentration, memory loss and suicidal ideas. The patient is nervous/anxious and has insomnia.     Patient Active Problem List   Diagnosis Date Noted  . Neuropathy 06/25/2018  . Seasonal allergic rhinitis due to pollen 06/25/2018  . Age related osteoporosis 12/19/2017  . Encounter for smoking cessation counseling 05/30/2017  . Anxiety 05/30/2017  . Dyslipidemia 05/30/2017  . Nicotine dependence with current use 05/15/2017  . Reactive depression 05/15/2017  . Essential hypertension 01/27/2015  . Thyroid activity decreased 01/27/2015  . Restless leg syndrome 01/27/2015  . Degenerative disc disease, cervical 01/27/2015    No Known Allergies  Past Surgical History:  Procedure Laterality Date  . BREAST BIOPSY Left 02/16/2017   left stereo path pend  . COLONOSCOPY  2013   cleared for 10 yrs- Dr Vira Agar?    Social History   Tobacco Use  . Smoking status: Current Every Day Smoker   Types: E-cigarettes  . Smokeless tobacco: Never Used  Substance Use Topics  . Alcohol use: Yes    Alcohol/week: 0.0 standard drinks    Comment: seldom  . Drug use: No     Medication list has been reviewed and updated.  Current Meds  Medication Sig  . alendronate (FOSAMAX) 70 MG tablet Take with a full glass of water on an empty stomach.  Marland Kitchen buPROPion (WELLBUTRIN SR) 150 MG 12 hr tablet Take 1 tablet (150 mg total) by mouth daily.  Marland Kitchen CALCIUM/MAGNESIUM/ZINC FORMULA PO Take 1 tablet by mouth daily.  . Cholecalciferol (VITAMIN D-3) 5000 units TABS Take 1 tablet by mouth daily.  . cyclobenzaprine (FLEXERIL) 10 MG tablet TAKE 1 TABLET BY MOUTH EVERYDAY AT BEDTIME  . fluticasone (FLONASE) 50 MCG/ACT nasal spray INSTILL 1-2 SPRAYS INTO EACH NOSTRIL TWICE DAILY  . gabapentin (NEURONTIN) 300 MG capsule TAKE 1 CAPSULE (300 MG TOTAL) BY MOUTH 3 (THREE) TIMES DAILY. (Patient taking differently: [facility])  . levothyroxine (SYNTHROID, LEVOTHROID) 75 MCG tablet One tablet daily  . metoprolol succinate (TOPROL-XL) 100 MG 24 hr tablet Take 1 tablet (100 mg total) by mouth daily. Take with or immediately following a meal.  . pramipexole (MIRAPEX) 0.5 MG tablet TAKE 1 TABLET BY MOUTH EVERY DAY  . triamcinolone cream (KENALOG) 0.1 % APPLY TO AFFECTED AREA TWICE A DAY  . [DISCONTINUED] ALPRAZolam (XANAX) 0.25 MG tablet Take 1 tablet (0.25 mg total) by mouth at bedtime as needed for anxiety.    PHQ 2/9 Scores 12/26/2018 06/25/2018 05/15/2017 10/09/2015  PHQ - 2 Score 0 0 1 0  PHQ- 9 Score 2 2 7  -    BP Readings from Last 3 Encounters:  12/26/18 130/80  06/25/18  140/80  03/26/18 118/64    Physical Exam Vitals signs and nursing note reviewed.  Constitutional:      General: She is not irritable.    Appearance: She is normal weight.  Neurological:     Mental Status: She is alert and oriented to person, place, and time.  Psychiatric:        Mood and Affect: Mood is anxious.     Wt Readings from  Last 3 Encounters:  12/26/18 165 lb (74.8 kg)  06/25/18 152 lb (68.9 kg)  03/26/18 146 lb (66.2 kg)    BP 130/80   Pulse 64   Ht 5\' 6"  (1.676 m)   Wt 165 lb (74.8 kg)   BMI 26.63 kg/m   Assessment and Plan:  Patient underwe nt tele-visit for medication refills.  1. Age related osteoporosis, unspecified pathological fracture presence Patient has not been taking her Fosamax and she is probably at or near the end of her dosing.  For this.  We will discontinue her Fosamax and will follow-up with DEXA scan in 6 months.  2. Encounter for smoking cessation counseling Patient has been advised of the health risks of smoking and counseled concerning cessation of tobacco products. I spent over 3 minutes for discussion and to answer questions.  Patient will continue bupropion 150 mg once a day.  Patient is continuing to smoke so this is probably doing more for her depression which is currently get a PHQ of 2 most likely due to this COVID concern. - buPROPion (WELLBUTRIN SR) 150 MG 12 hr tablet; Take 1 tablet (150 mg total) by mouth daily.  Dispense: 90 tablet; Refill: 1  3. Nicotine dependence with current use Patient has been advised of the health risks of smoking and counseled concerning cessation of tobacco products. I spent over 3 minutes for discussion and to answer questions. - buPROPion (WELLBUTRIN SR) 150 MG 12 hr tablet; Take 1 tablet (150 mg total) by mouth daily.  Dispense: 90 tablet; Refill: 1  4. Reactive depression PHQ was noted to be 2.  Will continue bupropion 150 mg once a day. - buPROPion (WELLBUTRIN SR) 150 MG 12 hr tablet; Take 1 tablet (150 mg total) by mouth daily.  Dispense: 90 tablet; Refill: 1  5. Hypothyroidism, unspecified type Chronic.  Controlled.  Reviewed last TSH well within normal limits will continue 75 mcg daily - levothyroxine (SYNTHROID) 75 MCG tablet; One tablet daily  Dispense: 90 tablet; Refill: 1 - TSH  6. Essential hypertension Chronic.  Controlled.   Continue Toprol-XL 100 mg once a day.  Reviewed renal function panel from previous March visit. - metoprolol succinate (TOPROL-XL) 100 MG 24 hr tablet; Take 1 tablet (100 mg total) by mouth daily. Take with or immediately following a meal.  Dispense: 90 tablet; Refill: 1 - Renal Function Panel  7. Restless leg syndrome Chronic.  Controlled.  Patient has been taking more dosings of the Mirapex and she should reiterated that she is at max dosing at 0.5 and that she needs to maintain this dosing. - pramipexole (MIRAPEX) 0.5 MG tablet; Take 1 tablet (0.5 mg total) by mouth daily.  Dispense: 90 tablet; Refill: 1

## 2018-12-26 NOTE — ED Triage Notes (Signed)
Pt reports a 5-10 minute episode of sharp stabbing central chest pain prior to arrival. Pt denies any current pain.

## 2019-01-01 ENCOUNTER — Other Ambulatory Visit: Payer: Self-pay | Admitting: Family Medicine

## 2019-01-01 DIAGNOSIS — B001 Herpesviral vesicular dermatitis: Secondary | ICD-10-CM

## 2019-03-02 ENCOUNTER — Other Ambulatory Visit: Payer: Self-pay | Admitting: Family Medicine

## 2019-03-02 DIAGNOSIS — G2581 Restless legs syndrome: Secondary | ICD-10-CM

## 2019-03-02 DIAGNOSIS — B001 Herpesviral vesicular dermatitis: Secondary | ICD-10-CM

## 2019-03-20 ENCOUNTER — Other Ambulatory Visit: Payer: Self-pay | Admitting: Family Medicine

## 2019-03-20 DIAGNOSIS — G629 Polyneuropathy, unspecified: Secondary | ICD-10-CM

## 2019-05-28 ENCOUNTER — Other Ambulatory Visit: Payer: Self-pay | Admitting: Family Medicine

## 2019-05-28 DIAGNOSIS — G2581 Restless legs syndrome: Secondary | ICD-10-CM

## 2019-05-28 DIAGNOSIS — G629 Polyneuropathy, unspecified: Secondary | ICD-10-CM

## 2019-06-16 ENCOUNTER — Encounter: Payer: Self-pay | Admitting: Family Medicine

## 2019-06-20 ENCOUNTER — Other Ambulatory Visit: Payer: Self-pay

## 2019-06-20 ENCOUNTER — Ambulatory Visit (INDEPENDENT_AMBULATORY_CARE_PROVIDER_SITE_OTHER): Payer: PRIVATE HEALTH INSURANCE | Admitting: Family Medicine

## 2019-06-20 ENCOUNTER — Encounter: Payer: Self-pay | Admitting: Family Medicine

## 2019-06-20 VITALS — BP 120/80 | HR 84 | Ht 66.0 in | Wt 167.0 lb

## 2019-06-20 DIAGNOSIS — E039 Hypothyroidism, unspecified: Secondary | ICD-10-CM

## 2019-06-20 DIAGNOSIS — I10 Essential (primary) hypertension: Secondary | ICD-10-CM | POA: Diagnosis not present

## 2019-06-20 DIAGNOSIS — G2581 Restless legs syndrome: Secondary | ICD-10-CM

## 2019-06-20 DIAGNOSIS — G629 Polyneuropathy, unspecified: Secondary | ICD-10-CM | POA: Diagnosis not present

## 2019-06-20 DIAGNOSIS — R0989 Other specified symptoms and signs involving the circulatory and respiratory systems: Secondary | ICD-10-CM

## 2019-06-20 DIAGNOSIS — M5030 Other cervical disc degeneration, unspecified cervical region: Secondary | ICD-10-CM

## 2019-06-20 MED ORDER — METOPROLOL SUCCINATE ER 100 MG PO TB24: 100.0000 mg | ORAL_TABLET | Freq: Every day | ORAL | 1 refills | Status: DC

## 2019-06-20 MED ORDER — GABAPENTIN 300 MG PO CAPS: ORAL_CAPSULE | ORAL | 0 refills | Status: AC

## 2019-06-20 MED ORDER — CYCLOBENZAPRINE HCL 10 MG PO TABS: ORAL_TABLET | ORAL | 6 refills | Status: DC

## 2019-06-20 MED ORDER — LEVOTHYROXINE SODIUM 75 MCG PO TABS: ORAL_TABLET | ORAL | 1 refills | Status: DC

## 2019-06-20 MED ORDER — PRAMIPEXOLE DIHYDROCHLORIDE 0.5 MG PO TABS: 0.5000 mg | ORAL_TABLET | Freq: Every day | ORAL | 1 refills | Status: DC

## 2019-06-20 NOTE — Progress Notes (Signed)
Date:  06/20/2019   Name:  Diane Glenn [facility]   DOB:  08/21/1950   MRN:  [id number]   Chief Complaint: Peripheral Neuropathy, restless leg, Hypothyroidism, and Hypertension  Hypertension This is a chronic problem. The current episode started more than 1 year ago. The problem has been gradually improving since onset. The problem is controlled. Associated symptoms include chest pain. Pertinent negatives include no anxiety, blurred vision, headaches, malaise/fatigue, neck pain, orthopnea, palpitations, peripheral edema, PND, shortness of breath or sweats. ("once in a while") There are no associated agents to hypertension. Risk factors for coronary artery disease include dyslipidemia. The current treatment provides moderate improvement. There are no compliance problems.  There is no history of angina, kidney disease, CAD/MI, CVA, heart failure, left ventricular hypertrophy, PVD or retinopathy. There is no history of chronic renal disease, a hypertension causing med or renovascular disease.  Neurologic Problem The patient's primary symptoms include focal sensory loss. The patient's pertinent negatives include no altered mental status, clumsiness or weakness. Primary symptoms comment: propioception/paresthesia/neuropathy. This is a chronic problem. The current episode started more than 1 year ago (10 years). The neurological problem developed insidiously. The problem has been rapidly worsening since onset. Affected Side: bilateral. Associated symptoms include chest pain. Pertinent negatives include no abdominal pain, back pain, bladder incontinence, bowel incontinence, dizziness, fatigue, fever, headaches, light-headedness, nausea, neck pain, palpitations, shortness of breath or vomiting. Past treatments include nothing. The treatment provided moderate relief. There is no history of a bleeding disorder, a clotting disorder, a CVA, dementia, head trauma, liver disease, mood changes or seizures.    Lab  Results  Component Value Date   CREATININE 0.92 12/26/2018   BUN 10 12/26/2018   NA 137 12/26/2018   K 3.6 12/26/2018   CL 103 12/26/2018   CO2 24 12/26/2018   Lab Results  Component Value Date   CHOL 278 (H) 06/25/2018   HDL 49 06/25/2018   LDLCALC Comment 06/25/2018   TRIG 461 (H) 06/25/2018   CHOLHDL 4.6 (H) 05/30/2017   Lab Results  Component Value Date   TSH 1.540 06/25/2018   No results found for: HGBA1C Lab Results  Component Value Date   WBC 5.7 12/26/2018   HGB 12.5 12/26/2018   HCT 37.4 12/26/2018   MCV 93.7 12/26/2018   PLT 271 12/26/2018   Lab Results  Component Value Date   ALT 24 10/19/2011   AST 18 10/19/2011   ALKPHOS 87 10/19/2011   BILITOT 0.5 10/19/2011     Review of Systems  Constitutional: Negative.  Negative for chills, fatigue, fever, malaise/fatigue and unexpected weight change.  HENT: Negative for congestion, ear discharge, ear pain, rhinorrhea, sinus pressure, sneezing and sore throat.   Eyes: Negative for blurred vision, photophobia, pain, discharge, redness and itching.  Respiratory: Negative for cough, shortness of breath, wheezing and stridor.   Cardiovascular: Positive for chest pain. Negative for palpitations, orthopnea and PND.  Gastrointestinal: Negative for abdominal pain, blood in stool, bowel incontinence, constipation, diarrhea, nausea and vomiting.  Endocrine: Negative for cold intolerance, heat intolerance, polydipsia, polyphagia and polyuria.  Genitourinary: Negative for bladder incontinence, dysuria, flank pain, frequency, hematuria, menstrual problem, pelvic pain, urgency, vaginal bleeding and vaginal discharge.  Musculoskeletal: Negative for arthralgias, back pain, myalgias and neck pain.  Skin: Negative for rash.  Allergic/Immunologic: Negative for environmental allergies and food allergies.  Neurological: Negative for dizziness, weakness, light-headedness, numbness and headaches.  Hematological: Negative for adenopathy.  Does not bruise/bleed easily.  Psychiatric/Behavioral: Negative for dysphoric  mood. The patient is not nervous/anxious.     Patient Active Problem List   Diagnosis Date Noted  . Neuropathy 06/25/2018  . Seasonal allergic rhinitis due to pollen 06/25/2018  . Age related osteoporosis 12/19/2017  . Encounter for smoking cessation counseling 05/30/2017  . Anxiety 05/30/2017  . Dyslipidemia 05/30/2017  . Nicotine dependence with current use 05/15/2017  . Reactive depression 05/15/2017  . Essential hypertension 01/27/2015  . Thyroid activity decreased 01/27/2015  . Restless leg syndrome 01/27/2015  . Degenerative disc disease, cervical 01/27/2015    No Known Allergies  Past Surgical History:  Procedure Laterality Date  . BREAST BIOPSY Left 02/16/2017   left stereo path pend  . COLONOSCOPY  2013   cleared for 10 yrs- Dr Vira Agar?    Social History   Tobacco Use  . Smoking status: Current Every Day Smoker    Types: Cigarettes  . Smokeless tobacco: Never Used  Substance Use Topics  . Alcohol use: Yes    Alcohol/week: 0.0 standard drinks    Comment: seldom  . Drug use: No     Medication list has been reviewed and updated.  Current Meds  Medication Sig  . cyclobenzaprine (FLEXERIL) 10 MG tablet TAKE 1 TABLET BY MOUTH EVERYDAY AT BEDTIME  . fluticasone (FLONASE) 50 MCG/ACT nasal spray INSTILL 1-2 SPRAYS INTO EACH NOSTRIL TWICE DAILY  . gabapentin (NEURONTIN) 300 MG capsule TAKE 1 CAPSULE (300 MG TOTAL) BY MOUTH 3 (THREE) TIMES DAILY. (Patient taking differently: [facility])  . levothyroxine (SYNTHROID) 75 MCG tablet One tablet daily  . metoprolol succinate (TOPROL-XL) 100 MG 24 hr tablet Take 1 tablet (100 mg total) by mouth daily. Take with or immediately following a meal.  . pramipexole (MIRAPEX) 0.5 MG tablet Take 1 tablet (0.5 mg total) by mouth daily.  Marland Kitchen rOPINIRole (REQUIP) 0.25 MG tablet TAKE 1 TABLET (0.25 MG TOTAL) BY MOUTH AT BEDTIME.  Marland Kitchen triamcinolone cream  (KENALOG) 0.1 % APPLY TO AFFECTED AREA TWICE A DAY    PHQ 2/9 Scores 06/20/2019 12/26/2018 06/25/2018 05/15/2017  PHQ - 2 Score 1 0 0 1  PHQ- 9 Score 3 2 2 7     BP Readings from Last 3 Encounters:  06/20/19 120/80  12/26/18 (!) 172/92  12/26/18 130/80    Physical Exam Constitutional:      General: She is not in acute distress.    Appearance: She is not diaphoretic.  HENT:     Head: Normocephalic and atraumatic.     Right Ear: Tympanic membrane, ear canal and external ear normal.     Left Ear: Tympanic membrane, ear canal and external ear normal.     Nose: Nose normal.     Mouth/Throat:     Mouth: Mucous membranes are moist.  Eyes:     General:        Right eye: No discharge.        Left eye: No discharge.     Conjunctiva/sclera: Conjunctivae normal.     Pupils: Pupils are equal, round, and reactive to light.  Neck:     Thyroid: No thyromegaly.     Vascular: No JVD.  Cardiovascular:     Rate and Rhythm: Normal rate and regular rhythm.     Pulses:          Carotid pulses are 1+ on the right side and 1+ on the left side.      Radial pulses are 1+ on the right side and 1+ on the left side.  Dorsalis pedis pulses are 0 on the right side and 0 on the left side.       Posterior tibial pulses are 0 on the right side and 0 on the left side.     Heart sounds: Normal heart sounds. No murmur. No friction rub. No gallop. No S3 or S4 sounds.   Pulmonary:     Effort: Pulmonary effort is normal.     Breath sounds: Normal breath sounds.  Abdominal:     General: Bowel sounds are normal.     Palpations: Abdomen is soft. There is no mass.     Tenderness: There is no abdominal tenderness. There is no guarding.  Musculoskeletal:        General: Normal range of motion.     Cervical back: Normal range of motion and neck supple.  Lymphadenopathy:     Cervical: No cervical adenopathy.  Skin:    General: Skin is warm and dry.  Neurological:     Mental Status: She is alert.      Sensory: Sensory deficit present.     Deep Tendon Reflexes: Reflexes are normal and symmetric.     Comments: Bilateral feet     Wt Readings from Last 3 Encounters:  06/20/19 167 lb (75.8 kg)  12/26/18 166 lb (75.3 kg)  12/26/18 165 lb (74.8 kg)    BP 120/80   Pulse 84   Ht 5\' 6"  (1.676 m)   Wt 167 lb (75.8 kg)   BMI 26.95 kg/m   Assessment and Plan:  1. Degenerative disc disease, cervical Chronic.  controlled.  Stable.  Patient rarely has to take the Flexeril but does so sometimes at night pending discomfort. - cyclobenzaprine (FLEXERIL) 10 MG tablet; One at night  Dispense: 30 tablet; Refill: 6  2. Essential hypertension .  Controlled.  Stable.  Continue metoprolol XL 100 mg once a day.  Will check BMP.  We will recheck patient in 6 months. - Basic Metabolic Panel (BMET) - metoprolol succinate (TOPROL-XL) 100 MG 24 hr tablet; Take 1 tablet (100 mg total) by mouth daily. Take with or immediately following a meal.  Dispense: 90 tablet; Refill: 1  3. Hypothyroidism, unspecified type .  Controlled.  Stable.  Will check TSH.  Pending TSH level will continue levothyroxine 75 mcg daily. - TSH - levothyroxine (SYNTHROID) 75 MCG tablet; One tablet daily  Dispense: 90 tablet; Refill: 1  4. Neuropathy Chronic.  Uncontrolled.?  Stable.  [facility] will put her on multiple medications for neuropathy that I do not know is secondary to her lumbar disease I have no notes.  Patient also takes medication for restless leg we will see below.  I am uncertain if this is due to a peripheral neuropathy?  Nerve conduction study, or a proprioceptive concern, or vascular insufficiency to the lower extremities since she is a longtime smoker.  Patient was put on gabapentin 300 mg 3 times a day by her and neurologist which I am uncomfortable continuing until we have this further evaluated.  Will refer to neurology for evaluation for these concerns and possible repeat nerve conduction study.  In the meantime  patient has been instructed to quit smoking. - gabapentin (NEURONTIN) 300 MG capsule; TAKE 1 CAPSULE (300 MG TOTAL) BY MOUTH 3 (THREE) TIMES DAILY.  Dispense: 270 capsule; Refill: 0 - Ambulatory referral to Neurology - Lipid Panel With LDL/HDL Ratio - Ambulatory referral to Vascular Surgery  5. Restless leg syndrome Patient for some unknown reasons on Requip and Mirapex  that she says that she is having to take for restless leg syndrome.  I have told her that I can only do 1 and she has elected to continue Mirapex 0.5 to take daily.  This will be part of the referral to neurology as well concerning control of her restless leg syndrome and whether this is really going on. - pramipexole (MIRAPEX) 0.5 MG tablet; Take 1 tablet (0.5 mg total) by mouth daily.  Dispense: 90 tablet; Refill: 1 - Ambulatory referral to Neurology  6. Decreased pulses in feet I am unable to get good pulses in the dorsalis pedis or posterior tibial.  Patient is a longtime smoker and I do have concerns for her smoking effect on her peripheral artery disease.  Will refer to vascular surgery for evaluation. - Ambulatory referral to Vascular Surgery

## 2019-06-21 LAB — TSH: TSH: 5.08 u[IU]/mL — ABNORMAL HIGH (ref 0.450–4.500)

## 2019-06-21 LAB — BASIC METABOLIC PANEL
BUN/Creatinine Ratio: 13 (ref 12–28)
BUN: 12 mg/dL (ref 8–27)
CO2: 23 mmol/L (ref 20–29)
Calcium: 9.7 mg/dL (ref 8.7–10.3)
Chloride: 103 mmol/L (ref 96–106)
Creatinine, Ser: 0.94 mg/dL (ref 0.57–1.00)
GFR calc Af Amer: 72 mL/min/{1.73_m2} (ref >59)
GFR calc non Af Amer: 63 mL/min/{1.73_m2} (ref >59)
Glucose: 110 mg/dL — ABNORMAL HIGH (ref 65–99)
Potassium: 4.6 mmol/L (ref 3.5–5.2)
Sodium: 141 mmol/L (ref 134–144)

## 2019-06-21 LAB — LIPID PANEL WITH LDL/HDL RATIO
Cholesterol, Total: 225 mg/dL — ABNORMAL HIGH (ref 100–199)
HDL: 53 mg/dL (ref >39)
LDL Chol Calc (NIH): 133 mg/dL — ABNORMAL HIGH (ref 0–99)
LDL/HDL Ratio: 2.5 ratio (ref 0.0–3.2)
Triglycerides: 217 mg/dL — ABNORMAL HIGH (ref 0–149)
VLDL Cholesterol Cal: 39 mg/dL (ref 5–40)

## 2019-06-24 ENCOUNTER — Other Ambulatory Visit: Payer: Self-pay

## 2019-06-24 DIAGNOSIS — E039 Hypothyroidism, unspecified: Secondary | ICD-10-CM

## 2019-06-24 MED ORDER — LEVOTHYROXINE SODIUM 88 MCG PO TABS: ORAL_TABLET | ORAL | 1 refills | Status: DC

## 2019-06-25 ENCOUNTER — Encounter (INDEPENDENT_AMBULATORY_CARE_PROVIDER_SITE_OTHER): Payer: Self-pay | Admitting: Vascular Surgery

## 2019-06-25 ENCOUNTER — Other Ambulatory Visit (INDEPENDENT_AMBULATORY_CARE_PROVIDER_SITE_OTHER): Payer: Self-pay | Admitting: *Deleted

## 2019-06-25 ENCOUNTER — Encounter: Payer: Self-pay | Admitting: Family Medicine

## 2019-06-27 ENCOUNTER — Other Ambulatory Visit: Payer: Self-pay | Admitting: Family Medicine

## 2019-06-27 DIAGNOSIS — F329 Major depressive disorder, single episode, unspecified: Secondary | ICD-10-CM

## 2019-06-27 DIAGNOSIS — F17200 Nicotine dependence, unspecified, uncomplicated: Secondary | ICD-10-CM

## 2019-06-27 DIAGNOSIS — Z716 Tobacco abuse counseling: Secondary | ICD-10-CM

## 2019-06-28 ENCOUNTER — Ambulatory Visit: Payer: Medicare HMO | Attending: Internal Medicine

## 2019-06-28 DIAGNOSIS — Z23 Encounter for immunization: Secondary | ICD-10-CM

## 2019-06-28 NOTE — Progress Notes (Signed)
   Covid-19 Vaccination Clinic  Name:  Diane Glenn    MRN: [id number] DOB: [birth year]  06/28/2019  Diane Glenn was observed post Covid-19 immunization for 15 minutes without incident. She was provided with Vaccine Information Sheet and instruction to access the V-Safe system.   Diane Glenn was instructed to call 911 with any severe reactions post vaccine: Marland Kitchen Difficulty breathing  . Swelling of face and throat  . A fast heartbeat  . A bad rash all over body  . Dizziness and weakness   Immunizations Administered    Name Date Dose VIS Date Route   Pfizer COVID-19 Vaccine 06/28/2019 11:59 AM 0.3 mL 03/15/2019 Intramuscular   Manufacturer: [employer], [employer]   Lot: [id number]   [place]: [id number]

## 2019-07-12 ENCOUNTER — Other Ambulatory Visit (INDEPENDENT_AMBULATORY_CARE_PROVIDER_SITE_OTHER): Payer: Self-pay | Admitting: Vascular Surgery

## 2019-07-12 DIAGNOSIS — R0989 Other specified symptoms and signs involving the circulatory and respiratory systems: Secondary | ICD-10-CM

## 2019-07-16 ENCOUNTER — Ambulatory Visit (INDEPENDENT_AMBULATORY_CARE_PROVIDER_SITE_OTHER): Payer: 59

## 2019-07-16 ENCOUNTER — Other Ambulatory Visit: Payer: Self-pay

## 2019-07-16 ENCOUNTER — Encounter (INDEPENDENT_AMBULATORY_CARE_PROVIDER_SITE_OTHER): Payer: Self-pay | Admitting: Vascular Surgery

## 2019-07-16 ENCOUNTER — Ambulatory Visit (INDEPENDENT_AMBULATORY_CARE_PROVIDER_SITE_OTHER): Payer: 59 | Admitting: Vascular Surgery

## 2019-07-16 VITALS — BP 174/79 | HR 69 | Resp 17 | Ht 66.0 in | Wt 167.0 lb

## 2019-07-16 DIAGNOSIS — M79609 Pain in unspecified limb: Secondary | ICD-10-CM | POA: Insufficient documentation

## 2019-07-16 DIAGNOSIS — M79604 Pain in right leg: Secondary | ICD-10-CM

## 2019-07-16 DIAGNOSIS — I10 Essential (primary) hypertension: Secondary | ICD-10-CM | POA: Diagnosis not present

## 2019-07-16 DIAGNOSIS — G2581 Restless legs syndrome: Secondary | ICD-10-CM

## 2019-07-16 DIAGNOSIS — R0989 Other specified symptoms and signs involving the circulatory and respiratory systems: Secondary | ICD-10-CM

## 2019-07-16 DIAGNOSIS — E785 Hyperlipidemia, unspecified: Secondary | ICD-10-CM

## 2019-07-16 DIAGNOSIS — M79605 Pain in left leg: Secondary | ICD-10-CM | POA: Diagnosis not present

## 2019-07-16 NOTE — Assessment & Plan Note (Signed)
blood pressure control important in reducing the progression of atherosclerotic disease. On appropriate oral medications.  

## 2019-07-16 NOTE — Progress Notes (Signed)
Patient ID: Diane Glenn, female   DOB: [birth year], 69 y.o.   MRN: [id number]  Chief Complaint  Patient presents with  . New Patient (Initial Visit)    ref. Ronnald Ramp decreased pulses     HPI Diane Glenn is a 69 y.o. female.  I am asked to see the patient by Dr. Ronnald Ramp for evaluation of decreased pulses.  The patient has significant restless leg syndrome that causes her a lot of pain and difficulty sleeping.  She denies any open ulceration or infection.  She does not have typical claudication symptoms.  No ischemic rest pain.  Both legs are affected.  To assess her arterial perfusion, noninvasive studies were performed today.  These demonstrated normal ABIs of 1.2 bilaterally with normal triphasic waveforms and digital pressures.     Past Medical History:  Diagnosis Date  . Hypertension   . Restless leg syndrome   . Thyroid disease     Past Surgical History:  Procedure Laterality Date  . BREAST BIOPSY Left 02/16/2017   left stereo path pend  . COLONOSCOPY  2013   cleared for 10 yrs- Dr Vira Agar?     Family History  Problem Relation Age of Onset  . Diabetes Father   . Stroke Father   . Breast cancer Neg Hx   No bleeding or clotting disorders   Social History   Tobacco Use  . Smoking status: Current Every Day Smoker    Types: Cigarettes  . Smokeless tobacco: Never Used  Substance Use Topics  . Alcohol use: Yes    Alcohol/week: 0.0 standard drinks    Comment: seldom  . Drug use: No     No Known Allergies  Current Outpatient Medications  Medication Sig Dispense Refill  . cyclobenzaprine (FLEXERIL) 10 MG tablet One at night 30 tablet 6  . fluticasone (FLONASE) 50 MCG/ACT nasal spray INSTILL 1-2 SPRAYS INTO EACH NOSTRIL TWICE DAILY 16 g 11  . gabapentin (NEURONTIN) 300 MG capsule TAKE 1 CAPSULE (300 MG TOTAL) BY MOUTH 3 (THREE) TIMES DAILY. 270 capsule 0  . levothyroxine (SYNTHROID) 88 MCG tablet One tablet daily 30 tablet 1  . metoprolol succinate  (TOPROL-XL) 100 MG 24 hr tablet Take 1 tablet (100 mg total) by mouth daily. Take with or immediately following a meal. 90 tablet 1  . pramipexole (MIRAPEX) 0.5 MG tablet Take 1 tablet (0.5 mg total) by mouth daily. 90 tablet 1  . rOPINIRole (REQUIP) 0.25 MG tablet TAKE 1 TABLET (0.25 MG TOTAL) BY MOUTH AT BEDTIME. 90 tablet 0  . triamcinolone cream (KENALOG) 0.1 % APPLY TO AFFECTED AREA TWICE A DAY 30 g 1   No current facility-administered medications for this visit.      REVIEW OF SYSTEMS (Negative unless checked)  Constitutional: [] Weight loss  [] Fever  [] Chills Cardiac: [] Chest pain   [] Chest pressure   [] Palpitations   [] Shortness of breath when laying flat   [] Shortness of breath at rest   [] Shortness of breath with exertion. Vascular:  [] Pain in legs with walking   [] Pain in legs at rest   [] Pain in legs when laying flat   [] Claudication   [] Pain in feet when walking  [] Pain in feet at rest  [] Pain in feet when laying flat   [] History of DVT   [] Phlebitis   [] Swelling in legs   [] Varicose veins   [] Non-healing ulcers Pulmonary:   [] Uses home oxygen   [] Productive cough   [] Hemoptysis   [] Wheeze  [] COPD   []   Asthma Neurologic:  [] Dizziness  [] Blackouts   [] Seizures   [] History of stroke   [] History of TIA  [] Aphasia   [] Temporary blindness   [] Dysphagia   [] Weakness or numbness in arms   [] Weakness or numbness in legs Musculoskeletal:  [] Arthritis   [] Joint swelling   [] Joint pain   [] Low back pain Hematologic:  [] Easy bruising  [] Easy bleeding   [] Hypercoagulable state   [] Anemic  [] Hepatitis Gastrointestinal:  [] Blood in stool   [] Vomiting blood  [] Gastroesophageal reflux/heartburn   [] Abdominal pain Genitourinary:  [] Chronic kidney disease   [] Difficult urination  [] Frequent urination  [] Burning with urination   [] Hematuria Skin:  [] Rashes   [] Ulcers   [] Wounds Psychological:  [] History of anxiety   []  History of major depression.    Physical Exam BP (!) 174/79 (BP Location: Right  Arm)   Pulse 69   Resp 17   Ht 5\' 6"  (1.676 m)   Wt 167 lb (75.8 kg)   BMI 26.95 kg/m  Gen:  WD/WN, NAD Head: [place]/AT, No temporalis wasting.  Ear/Nose/Throat: Hearing grossly intact, nares w/o erythema or drainage, oropharynx w/o Erythema/Exudate Eyes: Conjunctiva clear, sclera non-icteric  Neck: trachea midline.  No JVD.  Pulmonary:  Good air movement, respirations not labored, no use of accessory muscles  Cardiac: RRR, no JVD Vascular:  Vessel Right Left  Radial Palpable Palpable                          DP  2+  2+  PT  2+  1+    Musculoskeletal: M/S 5/5 throughout.  Extremities without ischemic changes.  No deformity or atrophy.  Trace left lower extremity edema. Neurologic: Sensation grossly intact in extremities.  Symmetrical.  Speech is fluent. Motor exam as listed above. Psychiatric: Judgment intact, Mood & affect appropriate for pt's clinical situation. Dermatologic: No rashes or ulcers noted.  No cellulitis or open wounds.    Radiology No results found.  Labs Recent Results (from the past 2160 hour(s))  TSH     Status: Abnormal   Collection Time: 06/20/19  9:38 AM  Result Value Ref Range   TSH 5.080 (H) 0.450 - 4.500 uIU/mL  Basic Metabolic Panel (BMET)     Status: Abnormal   Collection Time: 06/20/19  9:38 AM  Result Value Ref Range   Glucose 110 (H) 65 - 99 mg/dL   BUN 12 8 - 27 mg/dL   Creatinine, Ser 0.94 0.57 - 1.00 mg/dL   GFR calc non Af Amer 63 >59 mL/min/1.73   GFR calc Af Amer 72 >59 mL/min/1.73   BUN/Creatinine Ratio 13 12 - 28   Sodium 141 134 - 144 mmol/L   Potassium 4.6 3.5 - 5.2 mmol/L   Chloride 103 96 - 106 mmol/L   CO2 23 20 - 29 mmol/L   Calcium 9.7 8.7 - 10.3 mg/dL  Lipid Panel With LDL/HDL Ratio     Status: Abnormal   Collection Time: 06/20/19  9:38 AM  Result Value Ref Range   Cholesterol, Total 225 (H) 100 - 199 mg/dL   Triglycerides 217 (H) 0 - 149 mg/dL   HDL 53 >39 mg/dL   VLDL Cholesterol Cal 39 5 - 40 mg/dL   LDL Chol  Calc (NIH) 133 (H) 0 - 99 mg/dL   LDL/HDL Ratio 2.5 0.0 - 3.2 ratio    Comment:  LDL/HDL Ratio                                             Men  Women                               1/2 Avg.Risk  1.0    1.5                                   Avg.Risk  3.6    3.2                                2X Avg.Risk  6.2    5.0                                3X Avg.Risk  8.0    6.1     Assessment/Plan:  Restless leg syndrome On Requip.  Bothersome to the patient.  Essential hypertension blood pressure control important in reducing the progression of atherosclerotic disease. On appropriate oral medications.   Dyslipidemia lipid control important in reducing the progression of atherosclerotic disease.    Pain in limb To assess her arterial perfusion, noninvasive studies were performed today.  These demonstrated normal ABIs of 1.2 bilaterally with normal triphasic waveforms and digital pressures.  It does not appear as if her lower extremity symptoms are related to lack of blood flow.  No further vascular evaluation or testing is planned at this time.  She can follow-up as needed.      Leotis Pain 07/16/2019, 11:14 AM   This note was created with Dragon medical transcription system.  Any errors from dictation are unintentional.

## 2019-07-16 NOTE — Assessment & Plan Note (Signed)
To assess her arterial perfusion, noninvasive studies were performed today.  These demonstrated normal ABIs of 1.2 bilaterally with normal triphasic waveforms and digital pressures.  It does not appear as if her lower extremity symptoms are related to lack of blood flow.  No further vascular evaluation or testing is planned at this time.  She can follow-up as needed.

## 2019-07-16 NOTE — Patient Instructions (Signed)
Peripheral Vascular Disease  Peripheral vascular disease (PVD) is a disease of the blood vessels that are not part of your heart and brain. A simple term for PVD is poor circulation. In most cases, PVD narrows the blood vessels that carry blood from your heart to the rest of your body. This can reduce the supply of blood to your arms, legs, and internal organs, like your stomach or kidneys. However, PVD most often affects a person's lower legs and feet. Without treatment, PVD tends to get worse. PVD can also lead to acute ischemic limb. This is when an arm or leg suddenly cannot get enough blood. This is a medical emergency. Follow these instructions at home: Lifestyle  Do not use any products that contain nicotine or tobacco, such as cigarettes and e-cigarettes. If you need help quitting, ask your doctor.  Lose weight if you are overweight. Or, stay at a healthy weight as told by your doctor.  Eat a diet that is low in fat and cholesterol. If you need help, ask your doctor.  Exercise regularly. Ask your doctor for activities that are right for you. General instructions  Take over-the-counter and prescription medicines only as told by your doctor.  Take good care of your feet: ? Wear comfortable shoes that fit well. ? Check your feet often for any cuts or sores.  Keep all follow-up visits as told by your doctor This is important. Contact a doctor if:  You have cramps in your legs when you walk.  You have leg pain when you are at rest.  You have coldness in a leg or foot.  Your skin changes.  You are unable to get or have an erection (erectile dysfunction).  You have cuts or sores on your feet that do not heal. Get help right away if:  Your arm or leg turns cold, numb, and blue.  Your arms or legs become red, warm, swollen, painful, or numb.  You have chest pain.  You have trouble breathing.  You suddenly have weakness in your face, arm, or leg.  You become very  confused or you cannot speak.  You suddenly have a very bad headache.  You suddenly cannot see. Summary  Peripheral vascular disease (PVD) is a disease of the blood vessels.  A simple term for PVD is poor circulation. Without treatment, PVD tends to get worse.  Treatment may include exercise, low fat and low cholesterol diet, and quitting smoking. This information is not intended to replace advice given to you by your health care provider. Make sure you discuss any questions you have with your health care provider. Document Revised: 03/03/2017 Document Reviewed: 04/28/2016 Elsevier Patient Education  2020 Elsevier Inc.  

## 2019-07-16 NOTE — Assessment & Plan Note (Signed)
lipid control important in reducing the progression of atherosclerotic disease.   

## 2019-07-16 NOTE — Assessment & Plan Note (Signed)
On Requip.  Bothersome to the patient.

## 2019-07-18 ENCOUNTER — Encounter: Payer: Self-pay | Admitting: Family Medicine

## 2019-07-18 ENCOUNTER — Other Ambulatory Visit: Payer: Self-pay

## 2019-07-18 DIAGNOSIS — G2581 Restless legs syndrome: Secondary | ICD-10-CM

## 2019-07-18 NOTE — Progress Notes (Signed)
Ref to Dr Dorcas Mcmurray per pt request

## 2019-07-22 ENCOUNTER — Encounter (INDEPENDENT_AMBULATORY_CARE_PROVIDER_SITE_OTHER): Payer: Self-pay

## 2019-07-23 ENCOUNTER — Ambulatory Visit: Payer: Medicare HMO

## 2019-07-24 ENCOUNTER — Other Ambulatory Visit: Payer: Self-pay | Admitting: Family Medicine

## 2019-07-24 ENCOUNTER — Other Ambulatory Visit (INDEPENDENT_AMBULATORY_CARE_PROVIDER_SITE_OTHER): Payer: Self-pay | Admitting: Nurse Practitioner

## 2019-07-24 DIAGNOSIS — B001 Herpesviral vesicular dermatitis: Secondary | ICD-10-CM

## 2019-07-24 DIAGNOSIS — F329 Major depressive disorder, single episode, unspecified: Secondary | ICD-10-CM

## 2019-07-24 DIAGNOSIS — F17200 Nicotine dependence, unspecified, uncomplicated: Secondary | ICD-10-CM

## 2019-07-24 DIAGNOSIS — Z716 Tobacco abuse counseling: Secondary | ICD-10-CM

## 2019-07-24 NOTE — Telephone Encounter (Signed)
Pt requesting refill

## 2019-07-24 NOTE — Telephone Encounter (Signed)
Requested medication (s) are due for refill today -no  Requested medication (s) are on the active medication list -no  Future visit scheduled -yes  Last refill: Triamcinolone-2/23 Wellbutrin- discontinued at last visit  Notes to clinic: Patient requesting medications no longer on current medication list- sent for review   Requested Prescriptions  Pending Prescriptions Disp Refills   buPROPion (WELLBUTRIN SR) 150 MG 12 hr tablet [Pharmacy Med Name: BUPROPION HCL SR 150 MG TABLET] 90 tablet 1    Sig: TAKE 1 TABLET BY MOUTH EVERY DAY      Psychiatry: Antidepressants - bupropion Failed - 07/24/2019 11:46 AM      Failed - Last BP in normal range    BP Readings from Last 1 Encounters:  07/16/19 (!) 174/79          Passed - Completed PHQ-2 or PHQ-9 in the last 360 days.      Passed - Valid encounter within last 6 months    Recent Outpatient Visits           1 month ago Essential hypertension   [facility], [facility]   7 months ago Essential hypertension   [facility] Juline Patch, MD   1 year ago Essential hypertension   [facility] Juline Patch, MD   1 year ago Acute maxillary sinusitis, recurrence not specified   [facility], [facility]   1 year ago Acute maxillary sinusitis, recurrence not specified   [facility] Juline Patch, MD       Future Appointments             In 5 months Juline Patch, MD [facility]              triamcinolone cream (KENALOG) 0.1 % [Pharmacy Med Name: TRIAMCINOLONE 0.1% CREAM] 30 g 1    Sig: APPLY TO [place]      Dermatology:  Corticosteroids Passed - 07/24/2019 11:46 AM      Passed - Valid encounter within last 12 months    Recent Outpatient Visits           1 month ago Essential hypertension   [facility], MD   7 months ago Essential hypertension   [facility], MD   1  year ago Essential hypertension   [facility] Juline Patch, MD   1 year ago Acute maxillary sinusitis, recurrence not specified   [facility] Jul[facility], MD   1 year ago Acute maxillary sinusitis, recurrence not specified   [facility] Juline Patch, MD       Future Appointments             In 5 months Juline Patch, MD [facility], [facility]                Requested Prescriptions  Pending Prescriptions Disp Refills   buPROPion (WELLBUTRIN SR) 150 MG 12 hr tablet [Pharmacy Med Name: BUPROPION HCL SR 150 MG TABLET] 90 tablet 1    Sig: TAKE 1 TABLET BY MOUTH EVERY DAY      Psychiatry: Antidepressants - bupropion Failed - 07/24/2019 11:46 AM      Failed - Last BP in normal range    BP Readings from Last 1 Encounters:  07/16/19 (!) 174/79          Passed -  Completed PHQ-2 or PHQ-9 in the last 360 days.      Passed - Valid encounter within last 6 months    Recent Outpatient Visits           1 month ago Essential hypertension   [place], Deanna C, MD   7 months ago Essential hypertension   [place], Deanna C, MD   1 year ago Essential hypertension   [place], Deanna C, MD   1 year ago Acute maxillary sinusitis, recurrence not specified   [facility] Jul[facility], [facility]   1 year ago Acute maxillary sinusitis, recurrence not specified   [facility] Jul[facility], MD       Future Appointments             In 5 months Juline Patch, MD [facility], [facility]              triamcinolone cream (KENALOG) 0.1 % [Pharmacy Med Name: TRIAMCINOLONE 0.1% CREAM] 30 g 1    Sig: APPLY TO [place]      Dermatology:  Corticosteroids Passed - 07/24/2019 11:46 AM      Passed - Valid encounter within last 12 months    Recent Outpatient Visits           1 month ago Essential hypertension   [place], Deanna C,  MD   7 months ago Essential hypertension   [place], Deanna C, MD   1 year ago Essential hypertension   [place], Deanna C, MD   1 year ago Acute maxillary sinusitis, recurrence not specified   [facility] Juline [facility], MD   1 year ago Acute maxillary sinusitis, recurrence not specified   [facility] Juline Patch, MD       Future Appointments             In 5 months Juline Patch, MD [facility], [facility]

## 2019-07-25 NOTE — Telephone Encounter (Signed)
Pt no longer taking that medication per CMA

## 2019-07-31 ENCOUNTER — Ambulatory Visit: Payer: Medicare HMO | Attending: Internal Medicine

## 2019-07-31 DIAGNOSIS — Z23 Encounter for immunization: Secondary | ICD-10-CM

## 2019-07-31 NOTE — Progress Notes (Signed)
   Covid-19 Vaccination Clinic  Name:  Diane Glenn    MRN: [id number] DOB: 09/12/1950  07/31/2019  [name] was observed post Covid-19 immunization for 15 minutes without incident. She was provided with Vaccine Information Sheet and instruction to access the V-Safe system.   [name] was instructed to call 911 with any severe reactions post vaccine: Marland Kitchen Difficulty breathing  . Swelling of face and throat  . A fast heartbeat  . A bad rash all over body  . Dizziness and weakness   Immunizations Administered    Name Date Dose VIS Date Route   Pfizer COVID-19 Vaccine 07/31/2019 12:57 PM 0.3 mL 05/29/2018 Intramuscular   Manufacturer: [place]   Lot: [id number]   [place]: [id number]

## 2019-08-15 ENCOUNTER — Other Ambulatory Visit: Payer: Self-pay | Admitting: Family Medicine

## 2019-08-15 DIAGNOSIS — F17200 Nicotine dependence, unspecified, uncomplicated: Secondary | ICD-10-CM

## 2019-08-15 DIAGNOSIS — Z716 Tobacco abuse counseling: Secondary | ICD-10-CM

## 2019-08-15 DIAGNOSIS — J301 Allergic rhinitis due to pollen: Secondary | ICD-10-CM

## 2019-08-15 DIAGNOSIS — F329 Major depressive disorder, single episode, unspecified: Secondary | ICD-10-CM

## 2019-08-15 DIAGNOSIS — G2581 Restless legs syndrome: Secondary | ICD-10-CM

## 2019-08-15 DIAGNOSIS — B001 Herpesviral vesicular dermatitis: Secondary | ICD-10-CM

## 2019-08-15 DIAGNOSIS — E039 Hypothyroidism, unspecified: Secondary | ICD-10-CM

## 2019-08-15 NOTE — Telephone Encounter (Signed)
Requested Prescriptions  Pending Prescriptions Disp Refills  . fluticasone (FLONASE) 50 MCG/ACT nasal spray [Pharmacy Med Name: FLUTICASONE PROP 50 MCG SPRAY] 48 mL 3    Sig: INSTILL 1-2 SPRAYS INTO EACH NOSTRIL TWICE DAILY     Ear, Nose, and Throat: Nasal Preparations - Corticosteroids Passed - 08/15/2019  6:19 PM      Passed - Valid encounter within last 12 months    Recent Outpatient Visits          1 month ago Essential hypertension   [facility], MD   7 months ago Essential hypertension   [facility] Juline Patch, MD   1 year ago Essential hypertension   [facility] Jul[facility], MD   1 year ago Acute maxillary sinusitis, recurrence not specified   [facility] Juline Patch, MD   1 year ago Acute maxillary sinusitis, recurrence not specified   [facility] Juline Patch, MD      Future Appointments            In 4 months Juline Patch, MD [facility], [facility]           . buPROPion (WELLBUTRIN SR) 150 MG 12 hr tablet [Pharmacy Med Name: BUPROPION HCL SR 150 MG TABLET] 90 tablet 1    Sig: TAKE 1 TABLET BY MOUTH EVERY DAY     Psychiatry: Antidepressants - bupropion Failed - 08/15/2019  6:19 PM      Failed - Last BP in normal range    BP Readings from Last 1 Encounters:  07/16/19 (!) 174/79         Passed - Completed PHQ-2 or PHQ-9 in the last 360 days.      Passed - Valid encounter within last 6 months    Recent Outpatient Visits          1 month ago Essential hypertension   [facility] Patch, MD   7 months ago Essential hypertension   [place], Deanna C, MD   1 year ago Essential hypertension   [facility], MD   1 year ago Acute maxillary sinusitis, recurrence not specified   [facility]   1 year ago Acute maxillary sinusitis, recurrence not specified   [facility] Juline Patch, MD      Future Appointments            In 4 months Juline Patch, MD [facility]           . triamcinolone cream (KENALOG) 0.1 % [Pharmacy Med Name: TRIAMCINOLONE 0.1% CREAM] 30 g 1    Sig: APPLY TO [place]     Dermatology:  Corticosteroids Passed - 08/15/2019  6:19 PM      Passed - Valid encounter within last 12 months    Recent Outpatient Visits          1 month ago Essential hypertension   [facility], MD   7 months ago Essential hypertension   [place], Deanna C, MD   1 year ago Essential hypertension   [facility] Juline Patch, MD   1 year ago Acute maxillary sinusitis, recurrence not specified   [facility] Jul[facility], MD   1 year ago Acute maxillary sinusitis, recurrence not specified   [facility]      Future Appointments            In 4 months Juline Patch, MD [facility]           . rOPINIRole (REQUIP) 0.25 MG tablet [Pharmacy Med Name: ROPINIROLE HCL 0.25 MG TABLET] 90 tablet 0    Sig: TAKE 1 TABLET (0.25 MG TOTAL) BY MOUTH AT BEDTIME.     Neurology:  Parkinsonian Agents Failed - 08/15/2019  6:19 PM      Failed - Last BP in normal range    BP Readings from Last 1 Encounters:  07/16/19 (!) 174/79         Passed - Valid encounter within last 12 months    Recent Outpatient Visits          1 month ago Essential hypertension   [place], MD   7 months ago Essential hypertension   [place], Deanna C, MD   1 year ago Essential hypertension   [facility] Jul[facility] Patch, MD   1 year ago Acute maxillary sinusitis, recurrence not specified   [facility], MD   1 year ago Acute maxillary sinusitis, recurrence not specified   [facility] Juline Patch, MD      Future Appointments            In 4  months Juline Patch, MD [facility], [facility]

## 2019-08-15 NOTE — Telephone Encounter (Signed)
Patient has not had repeat lab for increased dosing- call to patient- left message to come to lab for TSH. Do not see lab order in chart. 30 day courtesy RF given

## 2019-08-16 NOTE — Telephone Encounter (Signed)
Lab for tsh

## 2019-08-19 ENCOUNTER — Other Ambulatory Visit: Payer: Self-pay

## 2019-08-19 DIAGNOSIS — E039 Hypothyroidism, unspecified: Secondary | ICD-10-CM

## 2019-08-19 MED ORDER — LEVOTHYROXINE SODIUM 88 MCG PO TABS: ORAL_TABLET | ORAL | 0 refills | Status: DC

## 2019-09-11 ENCOUNTER — Other Ambulatory Visit: Payer: Self-pay | Admitting: Family Medicine

## 2019-09-11 DIAGNOSIS — E039 Hypothyroidism, unspecified: Secondary | ICD-10-CM

## 2019-09-11 NOTE — Telephone Encounter (Signed)
TSH due in 6 months from 06/2019 per Dr. Ronnald Ramp note

## 2019-12-04 ENCOUNTER — Other Ambulatory Visit: Payer: Self-pay | Admitting: Family Medicine

## 2019-12-04 DIAGNOSIS — E039 Hypothyroidism, unspecified: Secondary | ICD-10-CM

## 2019-12-07 ENCOUNTER — Other Ambulatory Visit: Payer: Self-pay | Admitting: Family Medicine

## 2019-12-07 DIAGNOSIS — B001 Herpesviral vesicular dermatitis: Secondary | ICD-10-CM

## 2019-12-07 NOTE — Telephone Encounter (Signed)
   Notes to clinic Pt has been on this med a long time, I do not find it mentioned in a recent note, please assess.

## 2019-12-17 ENCOUNTER — Telehealth: Payer: Self-pay | Admitting: Family Medicine

## 2019-12-17 NOTE — Telephone Encounter (Signed)
Left message for patient to call back and schedule Medicare Annual Wellness Visit (AWV) either virtually/audio only or in office. Whichever the patients preference is.  No history of AWV; please schedule at anytime with [facility] Health Advisor.  This should be a 40 minute visit  AWV-I PER [place] AS OF 05/05/17

## 2019-12-20 ENCOUNTER — Other Ambulatory Visit: Payer: Self-pay | Admitting: Family Medicine

## 2019-12-20 DIAGNOSIS — I10 Essential (primary) hypertension: Secondary | ICD-10-CM

## 2019-12-23 ENCOUNTER — Other Ambulatory Visit: Payer: Self-pay

## 2019-12-23 ENCOUNTER — Encounter: Payer: Self-pay | Admitting: Family Medicine

## 2019-12-23 ENCOUNTER — Ambulatory Visit (INDEPENDENT_AMBULATORY_CARE_PROVIDER_SITE_OTHER): Payer: PRIVATE HEALTH INSURANCE | Admitting: Family Medicine

## 2019-12-23 VITALS — BP 120/70 | HR 66 | Temp 97.6°F | Ht 67.0 in | Wt 149.0 lb

## 2019-12-23 DIAGNOSIS — R05 Cough: Secondary | ICD-10-CM | POA: Diagnosis not present

## 2019-12-23 DIAGNOSIS — J0100 Acute maxillary sinusitis, unspecified: Secondary | ICD-10-CM | POA: Diagnosis not present

## 2019-12-23 DIAGNOSIS — R059 Cough, unspecified: Secondary | ICD-10-CM

## 2019-12-23 MED ORDER — AMOXICILLIN-POT CLAVULANATE 875-125 MG PO TABS: 1.0000 | ORAL_TABLET | Freq: Two times a day (BID) | ORAL | 0 refills | Status: DC

## 2019-12-23 MED ORDER — GUAIFENESIN-CODEINE 100-10 MG/5ML PO SYRP: 5.0000 mL | ORAL_SOLUTION | Freq: Four times a day (QID) | ORAL | 0 refills | Status: DC | PRN

## 2019-12-23 NOTE — Progress Notes (Signed)
Date:  12/23/2019   Name:  Diane Glenn [facility]   DOB:  [birth year]   MRN:  [id number]   Chief Complaint: Sinusitis (started 5 days ago with sneezing. No fever or chills. cough- white, thick production)  Sinusitis This is a new problem. The current episode started in the past 7 days. The problem has been gradually worsening since onset. There has been no fever. Associated symptoms include chills, congestion, coughing, diaphoresis, headaches, a hoarse voice, sinus pressure and a sore throat. Pertinent negatives include no ear pain, neck pain, shortness of breath or sneezing. Past treatments include oral decongestants. The treatment provided mild relief.    Lab Results  Component Value Date   CREATININE 0.94 06/20/2019   BUN 12 06/20/2019   NA 141 06/20/2019   K 4.6 06/20/2019   CL 103 06/20/2019   CO2 23 06/20/2019   Lab Results  Component Value Date   CHOL 225 (H) 06/20/2019   HDL 53 06/20/2019   LDLCALC 133 (H) 06/20/2019   TRIG 217 (H) 06/20/2019   CHOLHDL 4.6 (H) 05/30/2017   Lab Results  Component Value Date   TSH 5.080 (H) 06/20/2019   No results found for: HGBA1C Lab Results  Component Value Date   WBC 5.7 12/26/2018   HGB 12.5 12/26/2018   HCT 37.4 12/26/2018   MCV 93.7 12/26/2018   PLT 271 12/26/2018   Lab Results  Component Value Date   ALT 24 10/19/2011   AST 18 10/19/2011   ALKPHOS 87 10/19/2011   BILITOT 0.5 10/19/2011     Review of Systems  Constitutional: Positive for chills and diaphoresis. Negative for fatigue, fever and unexpected weight change.  HENT: Positive for congestion, hoarse voice, sinus pressure and sore throat. Negative for ear discharge, ear pain, rhinorrhea and sneezing.   Eyes: Negative for photophobia, pain, discharge, redness and itching.  Respiratory: Positive for cough. Negative for shortness of breath, wheezing and stridor.   Gastrointestinal: Negative for abdominal pain, blood in stool, constipation, diarrhea, nausea and  vomiting.  Endocrine: Negative for cold intolerance, heat intolerance, polydipsia, polyphagia and polyuria.  Genitourinary: Negative for dysuria, flank pain, frequency, hematuria, menstrual problem, pelvic pain, urgency, vaginal bleeding and vaginal discharge.  Musculoskeletal: Negative for arthralgias, back pain, myalgias and neck pain.  Skin: Negative for rash.  Allergic/Immunologic: Negative for environmental allergies and food allergies.  Neurological: Positive for headaches. Negative for dizziness, weakness, light-headedness and numbness.  Hematological: Negative for adenopathy. Does not bruise/bleed easily.  Psychiatric/Behavioral: Negative for dysphoric mood. The patient is not nervous/anxious.     Patient Active Problem List   Diagnosis Date Noted  . Pain in limb 07/16/2019  . Neuropathy 06/25/2018  . Seasonal allergic rhinitis due to pollen 06/25/2018  . Age related osteoporosis 12/19/2017  . Encounter for smoking cessation counseling 05/30/2017  . Anxiety 05/30/2017  . Dyslipidemia 05/30/2017  . Nicotine dependence with current use 05/15/2017  . Reactive depression 05/15/2017  . Multiple joint pain 09/29/2016  . Rheumatoid factor positive 09/29/2016  . Essential hypertension 01/27/2015  . Thyroid activity decreased 01/27/2015  . Restless leg syndrome 01/27/2015  . Degenerative disc disease, cervical 01/27/2015    No Known Allergies  Past Surgical History:  Procedure Laterality Date  . BREAST BIOPSY Left 02/16/2017   left stereo path pend  . COLONOSCOPY  2013   cleared for 10 yrs- Dr Vira Agar?    Social History   Tobacco Use  . Smoking status: Current Every Day Smoker    Types:  Cigarettes  . Smokeless tobacco: Never Used  Substance Use Topics  . Alcohol use: Yes    Alcohol/week: 0.0 standard drinks    Comment: seldom  . Drug use: No     Medication list has been reviewed and updated.  Current Meds  Medication Sig  . cyclobenzaprine (FLEXERIL) 10 MG  tablet One at night  . fluticasone (FLONASE) 50 MCG/ACT nasal spray INSTILL 1-2 SPRAYS INTO EACH NOSTRIL TWICE DAILY  . gabapentin (NEURONTIN) 300 MG capsule TAKE 1 CAPSULE (300 MG TOTAL) BY MOUTH 3 (THREE) TIMES DAILY. (Patient taking differently: TAKE 1 CAPSULE (300 MG TOTAL) BY MOUTH 3 (THREE) TIMES DAILY./ Oceanographer)  . levothyroxine (SYNTHROID) 88 MCG tablet TAKE 1 TABLET BY MOUTH EVERY DAY  . metoprolol succinate (TOPROL-XL) 100 MG 24 hr tablet TAKE 1 TABLET (100 MG TOTAL) BY MOUTH DAILY. TAKE WITH OR IMMEDIATELY FOLLOWING A MEAL.  . pramipexole (MIRAPEX) 0.5 MG tablet Take 1 tablet (0.5 mg total) by mouth daily.  Marland Kitchen rOPINIRole (REQUIP) 0.25 MG tablet TAKE 1 TABLET (0.25 MG TOTAL) BY MOUTH AT BEDTIME.  Marland Kitchen triamcinolone cream (KENALOG) 0.1 % APPLY TO AFFECTED AREA TWICE A DAY    PHQ 2/9 Scores 06/20/2019 12/26/2018 06/25/2018 05/15/2017  PHQ - 2 Score 1 0 0 1  PHQ- 9 Score 3 2 2 7     GAD 7 : Generalized Anxiety Score 06/20/2019 12/26/2018  Nervous, Anxious, on Edge 0 2  Control/stop worrying 0 1  Worry too much - different things 0 0  Trouble relaxing 0 0  Restless 0 0  Easily annoyed or irritable 1 1  Afraid - awful might happen 0 0  Total GAD 7 Score 1 4  Anxiety Difficulty Not difficult at all Not difficult at all    BP Readings from Last 3 Encounters:  12/23/19 120/70  07/16/19 (!) 174/79  06/20/19 120/80    Physical Exam Vitals and nursing note reviewed.  Constitutional:      Appearance: She is well-developed.  HENT:     Head: Normocephalic.     Right Ear: Tympanic membrane, ear canal and external ear normal.     Left Ear: Tympanic membrane, ear canal and external ear normal.     Nose: Nose normal. No congestion or rhinorrhea.     Mouth/Throat:     Mouth: Mucous membranes are moist.  Eyes:     General: Lids are everted, no foreign bodies appreciated. No scleral icterus.       Left eye: No foreign body or hordeolum.     Conjunctiva/sclera: Conjunctivae normal.      Right eye: Right conjunctiva is not injected.     Left eye: Left conjunctiva is not injected.     Pupils: Pupils are equal, round, and reactive to light.  Neck:     Thyroid: No thyromegaly.     Vascular: No JVD.     Trachea: No tracheal deviation.  Cardiovascular:     Rate and Rhythm: Normal rate and regular rhythm.     Heart sounds: Normal heart sounds. No murmur heard.  No friction rub. No gallop.   Pulmonary:     Effort: Pulmonary effort is normal. No respiratory distress.     Breath sounds: Normal breath sounds. No wheezing or rales.  Abdominal:     General: Bowel sounds are normal.     Palpations: Abdomen is soft. There is no mass.     Tenderness: There is no abdominal tenderness. There is no guarding or rebound.  Musculoskeletal:  General: No tenderness. Normal range of motion.     Cervical back: Normal range of motion and neck supple.  Lymphadenopathy:     Cervical: No cervical adenopathy.  Skin:    General: Skin is warm.     Findings: No rash.  Neurological:     Mental Status: She is alert and oriented to person, place, and time.     Cranial Nerves: No cranial nerve deficit.     Deep Tendon Reflexes: Reflexes normal.  Psychiatric:        Mood and Affect: Mood is not anxious or depressed.     Wt Readings from Last 3 Encounters:  12/23/19 149 lb (67.6 kg)  07/16/19 167 lb (75.8 kg)  06/20/19 167 lb (75.8 kg)    BP 120/70   Pulse 66   Temp 97.6 F (36.4 C) (Oral)   Ht 5\' 7"  (1.702 m)   Wt 149 lb (67.6 kg)   SpO2 96%   BMI 23.34 kg/m   Assessment and Plan:

## 2019-12-23 NOTE — Progress Notes (Signed)
Date:  12/23/2019   Name:  Diane Glenn [facility]   DOB:  09/03/1950   MRN:  [id number]   Chief Complaint: Sinusitis (started 5 days ago with sneezing. No fever or chills. cough- white, thick production)  Sinusitis This is a new problem. The current episode started in the past 7 days. The problem has been gradually improving since onset. There has been no fever. Pertinent negatives include no chills, congestion, coughing, diaphoresis, ear pain, headaches, hoarse voice, neck pain, shortness of breath, sinus pressure, sneezing, sore throat or swollen glands. The treatment provided moderate relief.    Lab Results  Component Value Date   CREATININE 0.94 06/20/2019   BUN 12 06/20/2019   NA 141 06/20/2019   K 4.6 06/20/2019   CL 103 06/20/2019   CO2 23 06/20/2019   Lab Results  Component Value Date   CHOL 225 (H) 06/20/2019   HDL 53 06/20/2019   LDLCALC 133 (H) 06/20/2019   TRIG 217 (H) 06/20/2019   CHOLHDL 4.6 (H) 05/30/2017   Lab Results  Component Value Date   TSH 5.080 (H) 06/20/2019   No results found for: HGBA1C Lab Results  Component Value Date   WBC 5.7 12/26/2018   HGB 12.5 12/26/2018   HCT 37.4 12/26/2018   MCV 93.7 12/26/2018   PLT 271 12/26/2018   Lab Results  Component Value Date   ALT 24 10/19/2011   AST 18 10/19/2011   ALKPHOS 87 10/19/2011   BILITOT 0.5 10/19/2011     Review of Systems  Constitutional: Negative.  Negative for chills, diaphoresis, fatigue, fever and unexpected weight change.  HENT: Negative for congestion, ear discharge, ear pain, hoarse voice, rhinorrhea, sinus pressure, sneezing and sore throat.   Eyes: Negative for photophobia, pain, discharge, redness and itching.  Respiratory: Negative for cough, shortness of breath, wheezing and stridor.   Gastrointestinal: Negative for abdominal pain, blood in stool, constipation, diarrhea, nausea and vomiting.  Endocrine: Negative for cold intolerance, heat intolerance, polydipsia, polyphagia  and polyuria.  Genitourinary: Negative for dysuria, flank pain, frequency, hematuria, menstrual problem, pelvic pain, urgency, vaginal bleeding and vaginal discharge.  Musculoskeletal: Negative for arthralgias, back pain, myalgias and neck pain.  Skin: Negative for rash.  Allergic/Immunologic: Negative for environmental allergies and food allergies.  Neurological: Negative for dizziness, weakness, light-headedness, numbness and headaches.  Hematological: Negative for adenopathy. Does not bruise/bleed easily.  Psychiatric/Behavioral: Negative for dysphoric mood. The patient is not nervous/anxious.     Patient Active Problem List   Diagnosis Date Noted  . Pain in limb 07/16/2019  . Neuropathy 06/25/2018  . Seasonal allergic rhinitis due to pollen 06/25/2018  . Age related osteoporosis 12/19/2017  . Encounter for smoking cessation counseling 05/30/2017  . Anxiety 05/30/2017  . Dyslipidemia 05/30/2017  . Nicotine dependence with current use 05/15/2017  . Reactive depression 05/15/2017  . Multiple joint pain 09/29/2016  . Rheumatoid factor positive 09/29/2016  . Essential hypertension 01/27/2015  . Thyroid activity decreased 01/27/2015  . Restless leg syndrome 01/27/2015  . Degenerative disc disease, cervical 01/27/2015    No Known Allergies  Past Surgical History:  Procedure Laterality Date  . BREAST BIOPSY Left 02/16/2017   left stereo path pend  . COLONOSCOPY  2013   cleared for 10 yrs- Dr Vira Agar?    Social History   Tobacco Use  . Smoking status: Current Every Day Smoker    Types: Cigarettes  . Smokeless tobacco: Never Used  Substance Use Topics  . Alcohol use: Yes  Alcohol/week: 0.0 standard drinks    Comment: seldom  . Drug use: No     Medication list has been reviewed and updated.  Current Meds  Medication Sig  . cyclobenzaprine (FLEXERIL) 10 MG tablet One at night  . fluticasone (FLONASE) 50 MCG/ACT nasal spray INSTILL 1-2 SPRAYS INTO EACH NOSTRIL TWICE  DAILY  . gabapentin (NEURONTIN) 300 MG capsule TAKE 1 CAPSULE (300 MG TOTAL) BY MOUTH 3 (THREE) TIMES DAILY. (Patient taking differently: TAKE 1 CAPSULE (300 MG TOTAL) BY MOUTH 3 (THREE) TIMES DAILY./ Oceanographer)  . levothyroxine (SYNTHROID) 88 MCG tablet TAKE 1 TABLET BY MOUTH EVERY DAY  . metoprolol succinate (TOPROL-XL) 100 MG 24 hr tablet TAKE 1 TABLET (100 MG TOTAL) BY MOUTH DAILY. TAKE WITH OR IMMEDIATELY FOLLOWING A MEAL.  . pramipexole (MIRAPEX) 0.5 MG tablet Take 1 tablet (0.5 mg total) by mouth daily.  Marland Kitchen rOPINIRole (REQUIP) 0.25 MG tablet TAKE 1 TABLET (0.25 MG TOTAL) BY MOUTH AT BEDTIME.  Marland Kitchen triamcinolone cream (KENALOG) 0.1 % APPLY TO AFFECTED AREA TWICE A DAY    PHQ 2/9 Scores 06/20/2019 12/26/2018 06/25/2018 05/15/2017  PHQ - 2 Score 1 0 0 1  PHQ- 9 Score 3 2 2 7     GAD 7 : Generalized Anxiety Score 06/20/2019 12/26/2018  Nervous, Anxious, on Edge 0 2  Control/stop worrying 0 1  Worry too much - different things 0 0  Trouble relaxing 0 0  Restless 0 0  Easily annoyed or irritable 1 1  Afraid - awful might happen 0 0  Total GAD 7 Score 1 4  Anxiety Difficulty Not difficult at all Not difficult at all    BP Readings from Last 3 Encounters:  12/23/19 120/70  07/16/19 (!) 174/79  06/20/19 120/80    Physical Exam Vitals and nursing note reviewed.  Constitutional:      General: She is not in acute distress.    Appearance: She is not diaphoretic.  HENT:     Head: Normocephalic and atraumatic.     Right Ear: External ear normal.     Left Ear: External ear normal.     Nose: Nose normal.  Eyes:     General:        Right eye: No discharge.        Left eye: No discharge.     Conjunctiva/sclera: Conjunctivae normal.     Pupils: Pupils are equal, round, and reactive to light.  Neck:     Thyroid: No thyromegaly.     Vascular: No JVD.  Cardiovascular:     Rate and Rhythm: Normal rate and regular rhythm.     Heart sounds: Normal heart sounds. No murmur heard.  No  friction rub. No gallop.   Pulmonary:     Effort: Pulmonary effort is normal.     Breath sounds: Normal breath sounds.  Abdominal:     General: Bowel sounds are normal.     Palpations: Abdomen is soft. There is no mass.     Tenderness: There is no abdominal tenderness. There is no guarding.  Musculoskeletal:        General: Normal range of motion.     Cervical back: Normal range of motion and neck supple.  Lymphadenopathy:     Cervical: No cervical adenopathy.  Skin:    General: Skin is warm and dry.  Neurological:     Mental Status: She is alert.     Deep Tendon Reflexes: Reflexes are normal and symmetric.     Wt  Readings from Last 3 Encounters:  12/23/19 149 lb (67.6 kg)  07/16/19 167 lb (75.8 kg)  06/20/19 167 lb (75.8 kg)    BP 120/70   Pulse 66   Temp 97.6 F (36.4 C) (Oral)   Ht 5\' 7"  (1.702 m)   Wt 149 lb (67.6 kg)   SpO2 96%   BMI 23.34 kg/m   Assessment and Plan: 1. Acute maxillary sinusitis, recurrence not specified.  Controlled.  Acute.  Stable.  Augmentin 875 mg twice a day. - amoxicillin-clavulanate (AUGMENTIN) 875-125 MG tablet; Take 1 tablet by mouth 2 (two) times daily.  Dispense: 20 tablet; Refill: 0  2. Cough She has acute cough particularly at night.  We will treat with Robitussin-AC 1 teaspoon every 6 hours as needed cough. - guaiFENesin-codeine (ROBITUSSIN AC) 100-10 MG/5ML syrup; Take 5 mLs by mouth 4 (four) times daily as needed for cough.  Dispense: 118 mL; Refill: 0

## 2019-12-24 ENCOUNTER — Ambulatory Visit: Payer: PRIVATE HEALTH INSURANCE | Admitting: Family Medicine

## 2020-01-10 ENCOUNTER — Other Ambulatory Visit: Payer: Self-pay | Admitting: Family Medicine

## 2020-01-10 DIAGNOSIS — B001 Herpesviral vesicular dermatitis: Secondary | ICD-10-CM

## 2020-01-14 ENCOUNTER — Other Ambulatory Visit: Payer: Self-pay | Admitting: Family Medicine

## 2020-01-14 DIAGNOSIS — I10 Essential (primary) hypertension: Secondary | ICD-10-CM

## 2020-01-14 NOTE — Telephone Encounter (Signed)
Requested Prescriptions  Pending Prescriptions Disp Refills  . metoprolol succinate (TOPROL-XL) 100 MG 24 hr tablet [Pharmacy Med Name: METOPROLOL SUCC ER 100 MG TAB] 6 tablet 0    Sig: TAKE 1 TABLET (100 MG TOTAL) BY MOUTH DAILY. TAKE WITH OR IMMEDIATELY FOLLOWING A MEAL.     Cardiovascular:  Beta Blockers Passed - 01/14/2020  9:30 AM      Passed - Last BP in normal range    BP Readings from Last 1 Encounters:  12/23/19 120/70         Passed - Last Heart Rate in normal range    Pulse Readings from Last 1 Encounters:  12/23/19 66         Passed - Valid encounter within last 6 months    Recent Outpatient Visits          3 weeks ago Acute maxillary sinusitis, recurrence not specified   [facility] Juline Patch, MD   6 months ago Essential hypertension   [place], Deanna C, MD   1 year ago Essential hypertension   [place], Deanna C, MD   1 year ago Essential hypertension   [facility] Juline Patch, MD   1 year ago Acute maxillary sinusitis, recurrence not specified   [facility] Juline Patch, MD      Future Appointments            In 6 days Juline Patch, MD [facility], [facility]

## 2020-01-19 ENCOUNTER — Other Ambulatory Visit: Payer: Self-pay | Admitting: Family Medicine

## 2020-01-19 DIAGNOSIS — I10 Essential (primary) hypertension: Secondary | ICD-10-CM

## 2020-01-20 ENCOUNTER — Ambulatory Visit (INDEPENDENT_AMBULATORY_CARE_PROVIDER_SITE_OTHER): Payer: PRIVATE HEALTH INSURANCE | Admitting: Family Medicine

## 2020-01-20 ENCOUNTER — Encounter: Payer: Self-pay | Admitting: Family Medicine

## 2020-01-20 ENCOUNTER — Other Ambulatory Visit: Payer: Self-pay

## 2020-01-20 VITALS — BP 110/70 | HR 60 | Ht 67.0 in | Wt 146.0 lb

## 2020-01-20 DIAGNOSIS — G2581 Restless legs syndrome: Secondary | ICD-10-CM

## 2020-01-20 DIAGNOSIS — Z23 Encounter for immunization: Secondary | ICD-10-CM

## 2020-01-20 DIAGNOSIS — E7801 Familial hypercholesterolemia: Secondary | ICD-10-CM | POA: Diagnosis not present

## 2020-01-20 DIAGNOSIS — F17200 Nicotine dependence, unspecified, uncomplicated: Secondary | ICD-10-CM | POA: Diagnosis not present

## 2020-01-20 DIAGNOSIS — J301 Allergic rhinitis due to pollen: Secondary | ICD-10-CM

## 2020-01-20 DIAGNOSIS — M5030 Other cervical disc degeneration, unspecified cervical region: Secondary | ICD-10-CM | POA: Diagnosis not present

## 2020-01-20 DIAGNOSIS — I10 Essential (primary) hypertension: Secondary | ICD-10-CM | POA: Diagnosis not present

## 2020-01-20 DIAGNOSIS — E039 Hypothyroidism, unspecified: Secondary | ICD-10-CM

## 2020-01-20 MED ORDER — METOPROLOL SUCCINATE ER 100 MG PO TB24: 100.0000 mg | ORAL_TABLET | Freq: Every day | ORAL | 1 refills | Status: DC

## 2020-01-20 MED ORDER — CYCLOBENZAPRINE HCL 10 MG PO TABS: ORAL_TABLET | ORAL | 6 refills | Status: DC

## 2020-01-20 MED ORDER — LEVOTHYROXINE SODIUM 88 MCG PO TABS: 88.0000 ug | ORAL_TABLET | Freq: Every day | ORAL | 1 refills | Status: DC

## 2020-01-20 MED ORDER — ROPINIROLE HCL 0.25 MG PO TABS: 0.2500 mg | ORAL_TABLET | Freq: Every day | ORAL | 1 refills | Status: DC

## 2020-01-20 MED ORDER — FLUTICASONE PROPIONATE 50 MCG/ACT NA SUSP: NASAL | 3 refills | Status: DC

## 2020-01-20 MED ORDER — PRAMIPEXOLE DIHYDROCHLORIDE 0.5 MG PO TABS: 0.5000 mg | ORAL_TABLET | Freq: Every day | ORAL | 1 refills | Status: DC

## 2020-01-20 NOTE — Progress Notes (Signed)
Date:  01/20/2020   Name:  Diane Glenn   DOB:  1950/05/16   MRN:  [id number]   Chief Complaint: Allergic Rhinitis , Hypothyroidism, Hypertension, restless leg, and Flu Vaccine  Hypertension This is a chronic problem. The current episode started more than 1 year ago. The problem has been gradually improving since onset. The problem is controlled. Pertinent negatives include no anxiety, blurred vision, chest pain, headaches, malaise/fatigue, neck pain, orthopnea, palpitations, peripheral edema, PND, shortness of breath or sweats. There are no associated agents to hypertension. Risk factors for coronary artery disease include dyslipidemia and smoking/tobacco exposure. Past treatments include beta blockers. The current treatment provides moderate improvement. There are no compliance problems.  There is no history of angina, kidney disease, CAD/MI, CVA, heart failure, left ventricular hypertrophy, PVD or retinopathy. Identifiable causes of hypertension include a thyroid problem. There is no history of chronic renal disease or a hypertension causing med.  URI  This is a chronic (for allergic rhinitis) problem. The current episode started more than 1 year ago. The problem has been gradually improving. There has been no fever. Associated symptoms include congestion and rhinorrhea. Pertinent negatives include no abdominal pain, chest pain, coughing, diarrhea, dysuria, ear pain, headaches, nausea, neck pain, rash, sneezing, sore throat, vomiting or wheezing. The treatment provided moderate relief.  Neurologic Problem The patient's pertinent negatives include no weakness. Primary symptoms comment: for restless leg syndrome. This is a chronic problem. The current episode started more than 1 year ago. The neurological problem developed gradually. There was lower extremity, left-sided and right-sided focality noted. Pertinent negatives include no abdominal pain, back pain, chest pain, confusion, dizziness,  fatigue, fever, headaches, light-headedness, nausea, neck pain, palpitations, shortness of breath or vomiting. (Paresthesia) Treatments tried: mirapex/breakthrough ropinidrol. The treatment provided moderate relief.  Thyroid Problem Presents for follow-up visit. Patient reports no anxiety, cold intolerance, constipation, depressed mood, diarrhea, fatigue, heat intolerance, menstrual problem or palpitations. There is no history of heart failure.    Lab Results  Component Value Date   CREATININE 0.94 06/20/2019   BUN 12 06/20/2019   NA 141 06/20/2019   K 4.6 06/20/2019   CL 103 06/20/2019   CO2 23 06/20/2019   Lab Results  Component Value Date   CHOL 225 (H) 06/20/2019   HDL 53 06/20/2019   LDLCALC 133 (H) 06/20/2019   TRIG 217 (H) 06/20/2019   CHOLHDL 4.6 (H) 05/30/2017   Lab Results  Component Value Date   TSH 5.080 (H) 06/20/2019   No results found for: HGBA1C Lab Results  Component Value Date   WBC 5.7 12/26/2018   HGB 12.5 12/26/2018   HCT 37.4 12/26/2018   MCV 93.7 12/26/2018   PLT 271 12/26/2018   Lab Results  Component Value Date   ALT 24 10/19/2011   AST 18 10/19/2011   ALKPHOS 87 10/19/2011   BILITOT 0.5 10/19/2011     Review of Systems  Constitutional: Negative.  Negative for chills, fatigue, fever, malaise/fatigue and unexpected weight change.  HENT: Positive for congestion and rhinorrhea. Negative for ear discharge, ear pain, sinus pressure, sneezing and sore throat.   Eyes: Negative for blurred vision, photophobia, pain, discharge, redness and itching.  Respiratory: Negative for cough, shortness of breath, wheezing and stridor.   Cardiovascular: Negative for chest pain, palpitations, orthopnea and PND.  Gastrointestinal: Negative for abdominal pain, blood in stool, constipation, diarrhea, nausea and vomiting.  Endocrine: Negative for cold intolerance, heat intolerance, polydipsia, polyphagia and polyuria.  Genitourinary: Negative for  dysuria, flank  pain, frequency, hematuria, menstrual problem, pelvic pain, urgency, vaginal bleeding and vaginal discharge.  Musculoskeletal: Negative for arthralgias, back pain, myalgias and neck pain.  Skin: Negative for rash.  Allergic/Immunologic: Negative for environmental allergies and food allergies.  Neurological: Negative for dizziness, weakness, light-headedness, numbness and headaches.  Hematological: Negative for adenopathy. Does not bruise/bleed easily.  Psychiatric/Behavioral: Negative for confusion and dysphoric mood. The patient is not nervous/anxious.     Patient Active Problem List   Diagnosis Date Noted  . Pain in limb 07/16/2019  . Neuropathy 06/25/2018  . Seasonal allergic rhinitis due to pollen 06/25/2018  . Age related osteoporosis 12/19/2017  . Encounter for smoking cessation counseling 05/30/2017  . Anxiety 05/30/2017  . Dyslipidemia 05/30/2017  . Nicotine dependence with current use 05/15/2017  . Reactive depression 05/15/2017  . Multiple joint pain 09/29/2016  . Rheumatoid factor positive 09/29/2016  . Essential hypertension 01/27/2015  . Thyroid activity decreased 01/27/2015  . Restless leg syndrome 01/27/2015  . Degenerative disc disease, cervical 01/27/2015    No Known Allergies  Past Surgical History:  Procedure Laterality Date  . BREAST BIOPSY Left 02/16/2017   left stereo path pend  . COLONOSCOPY  2013   cleared for 10 yrs- Dr Vira Agar?    Social History   Tobacco Use  . Smoking status: Current Every Day Smoker    Types: Cigarettes  . Smokeless tobacco: Never Used  Substance Use Topics  . Alcohol use: Yes    Alcohol/week: 0.0 standard drinks    Comment: seldom  . Drug use: No     Medication list has been reviewed and updated.  Current Meds  Medication Sig  . cyclobenzaprine (FLEXERIL) 10 MG tablet One at night  . fluticasone (FLONASE) 50 MCG/ACT nasal spray INSTILL 1-2 SPRAYS INTO EACH NOSTRIL TWICE DAILY  . gabapentin (NEURONTIN) 300 MG  capsule TAKE 1 CAPSULE (300 MG TOTAL) BY MOUTH 3 (THREE) TIMES DAILY. (Patient taking differently: TAKE 1 CAPSULE (300 MG TOTAL) BY MOUTH 3 (THREE) TIMES DAILY./ Oceanographer)  . levothyroxine (SYNTHROID) 88 MCG tablet TAKE 1 TABLET BY MOUTH EVERY DAY  . metoprolol succinate (TOPROL-XL) 100 MG 24 hr tablet TAKE 1 TABLET (100 MG TOTAL) BY MOUTH DAILY. TAKE WITH OR IMMEDIATELY FOLLOWING A MEAL.  . pramipexole (MIRAPEX) 0.5 MG tablet Take 1 tablet (0.5 mg total) by mouth daily.  Marland Kitchen rOPINIRole (REQUIP) 0.25 MG tablet TAKE 1 TABLET (0.25 MG TOTAL) BY MOUTH AT BEDTIME.    PHQ 2/9 Scores 01/20/2020 06/20/2019 12/26/2018 06/25/2018  PHQ - 2 Score 0 1 0 0  PHQ- 9 Score 0 3 2 2     GAD 7 : Generalized Anxiety Score 01/20/2020 06/20/2019 12/26/2018  Nervous, Anxious, on Edge 0 0 2  Control/stop worrying 0 0 1  Worry too much - different things 0 0 0  Trouble relaxing 0 0 0  Restless 0 0 0  Easily annoyed or irritable 0 1 1  Afraid - awful might happen 0 0 0  Total GAD 7 Score 0 1 4  Anxiety Difficulty - Not difficult at all Not difficult at all    BP Readings from Last 3 Encounters:  01/20/20 110/70  12/23/19 120/70  07/16/19 (!) 174/79    Physical Exam Vitals and nursing note reviewed.  Constitutional:      General: She is not in acute distress.    Appearance: She is not diaphoretic.  HENT:     Head: Normocephalic and atraumatic.     Right Ear:  External ear normal.     Left Ear: External ear normal.     Nose: Nose normal.  Eyes:     General:        Right eye: No discharge.        Left eye: No discharge.     Conjunctiva/sclera: Conjunctivae normal.     Pupils: Pupils are equal, round, and reactive to light.  Neck:     Thyroid: No thyromegaly.     Vascular: No JVD.  Cardiovascular:     Rate and Rhythm: Normal rate and regular rhythm.     Heart sounds: Normal heart sounds. No murmur heard.  No friction rub. No gallop.   Pulmonary:     Effort: Pulmonary effort is normal.     Breath  sounds: Normal breath sounds.  Abdominal:     General: Bowel sounds are normal.     Palpations: Abdomen is soft. There is no mass.     Tenderness: There is no abdominal tenderness. There is no guarding.  Musculoskeletal:        General: Normal range of motion.     Cervical back: Normal range of motion and neck supple.  Lymphadenopathy:     Cervical: No cervical adenopathy.  Skin:    General: Skin is warm and dry.  Neurological:     Mental Status: She is alert.     Deep Tendon Reflexes: Reflexes are normal and symmetric.     Wt Readings from Last 3 Encounters:  01/20/20 146 lb (66.2 kg)  12/23/19 149 lb (67.6 kg)  07/16/19 167 lb (75.8 kg)    BP 110/70   Pulse 60   Ht 5\' 7"  (1.702 m)   Wt 146 lb (66.2 kg)   BMI 22.87 kg/m   Assessment and Plan: 1. Essential hypertension Chronic.  Controlled.  Stable.  Continue Toprol-XL 100 mg once a day.  Will check renal function panel. - metoprolol succinate (TOPROL-XL) 100 MG 24 hr tablet; Take 1 tablet (100 mg total) by mouth daily. Take with or immediately following a meal.  Dispense: 90 tablet; Refill: 1 - Renal Function Panel  2. Hypothyroidism, unspecified type Chronic.  Controlled.  Stable.  Will check TSH in the meantime we anticipate continuing 88 mcg daily. - levothyroxine (SYNTHROID) 88 MCG tablet; Take 1 tablet (88 mcg total) by mouth daily.  Dispense: 90 tablet; Refill: 1 - Thyroid Panel With TSH  3. Degenerative disc disease, cervical Chronic.  Controlled.  Stable.  Continue cyclobenzaprine 10 mg nightly. - cyclobenzaprine (FLEXERIL) 10 MG tablet; One at night  Dispense: 30 tablet; Refill: 6  4. Seasonal allergic rhinitis due to pollen Chronic.  Controlled.  Stable.  Continue fluticasone nasal spray 1 spray each nostril daily. - fluticasone (FLONASE) 50 MCG/ACT nasal spray; 1 spray each nostril daily  Dispense: 48 mL; Refill: 3  5. Restless leg syndrome Chronic.  Controlled.  Stable.  Continue Mirapex 0.5 mg once a  day with backup of Requip 0.25 mg nightly on an as-needed basis. - pramipexole (MIRAPEX) 0.5 MG tablet; Take 1 tablet (0.5 mg total) by mouth daily.  Dispense: 90 tablet; Refill: 1 - rOPINIRole (REQUIP) 0.25 MG tablet; Take 1 tablet (0.25 mg total) by mouth at bedtime.  Dispense: 90 tablet; Refill: 1  6. Nicotine dependence with current use Patient has been advised of the health risks of smoking and counseled concerning cessation of tobacco products. I spent over 3 minutes for discussion and to answer questions.  7. Familial hypercholesterolemia Chronic.  Uncontrolled.  Stable.  Will check lipid panel for current LDL and ratio status. - Lipid Panel With LDL/HDL Ratio  8. Need for immunization against influenza Discussed and administered. - Flu Vaccine QUAD High Dose(Fluad)

## 2020-01-20 NOTE — Telephone Encounter (Signed)
Requested medication (s) are due for refill today - yes  Requested medication (s) are on the active medication list -yes  Future visit scheduled -yes- today  Last refill: 01/14/20  Notes to clinic: Patient has appointment today- limited quantity given at last RF- sent for RF at appointment  Requested Prescriptions  Pending Prescriptions Disp Refills   metoprolol succinate (TOPROL-XL) 100 MG 24 hr tablet [Pharmacy Med Name: METOPROLOL SUCC ER 100 MG TAB] 6 tablet 0    Sig: TAKE 1 TABLET BY MOUTH DAILY. TAKE WITH OR IMMEDIATELY FOLLOWING A MEAL.      Cardiovascular:  Beta Blockers Passed - 01/19/2020 10:01 PM      Passed - Last BP in normal range    BP Readings from Last 1 Encounters:  12/23/19 120/70          Passed - Last Heart Rate in normal range    Pulse Readings from Last 1 Encounters:  12/23/19 66          Passed - Valid encounter within last 6 months    Recent Outpatient Visits           4 weeks ago Acute maxillary sinusitis, recurrence not specified   [facility] Juline Patch, MD   7 months ago Essential hypertension   [facility] Juline Patch, MD   1 year ago Essential hypertension   [facility] Juline Patch, MD   1 year ago Essential hypertension   [facility] Juline Patch, MD   1 year ago Acute maxillary sinusitis, recurrence not specified   [facility] Juline Patch, MD       Future Appointments             Today Juline Patch, MD [facility], [facility]                Requested Prescriptions  Pending Prescriptions Disp Refills   metoprolol succinate (TOPROL-XL) 100 MG 24 hr tablet [Pharmacy Med Name: METOPROLOL SUCC ER 100 MG TAB] 6 tablet 0    Sig: TAKE 1 TABLET BY MOUTH DAILY. TAKE WITH OR IMMEDIATELY FOLLOWING A MEAL.      Cardiovascular:  Beta Blockers Passed - 01/19/2020 10:01 PM      Passed - Last BP in normal range    BP Readings from Last 1 Encounters:  12/23/19  120/70          Passed - Last Heart Rate in normal range    Pulse Readings from Last 1 Encounters:  12/23/19 66          Passed - Valid encounter within last 6 months    Recent Outpatient Visits           4 weeks ago Acute maxillary sinusitis, recurrence not specified   [facility] Juline Patch, MD   7 months ago Essential hypertension   [place], Deanna C, MD   1 year ago Essential hypertension   [place], Deanna C, MD   1 year ago Essential hypertension   [facility] Juline Patch, MD   1 year ago Acute maxillary sinusitis, recurrence not specified   [facility] Juline Patch, MD       Future Appointments             Today Juline Patch, MD [facility], [facility]

## 2020-01-21 ENCOUNTER — Other Ambulatory Visit: Payer: Self-pay

## 2020-01-21 ENCOUNTER — Encounter: Payer: Self-pay | Admitting: Family Medicine

## 2020-01-21 DIAGNOSIS — E039 Hypothyroidism, unspecified: Secondary | ICD-10-CM

## 2020-01-21 LAB — RENAL FUNCTION PANEL
Albumin: 4.5 g/dL (ref 3.8–4.8)
BUN/Creatinine Ratio: 11 — ABNORMAL LOW (ref 12–28)
BUN: 8 mg/dL (ref 8–27)
CO2: 24 mmol/L (ref 20–29)
Calcium: 9.8 mg/dL (ref 8.7–10.3)
Chloride: 100 mmol/L (ref 96–106)
Creatinine, Ser: 0.74 mg/dL (ref 0.57–1.00)
GFR calc Af Amer: 96 mL/min/{1.73_m2} (ref >59)
GFR calc non Af Amer: 84 mL/min/{1.73_m2} (ref >59)
Glucose: 94 mg/dL (ref 65–99)
Phosphorus: 3.6 mg/dL (ref 3.0–4.3)
Potassium: 4.2 mmol/L (ref 3.5–5.2)
Sodium: 136 mmol/L (ref 134–144)

## 2020-01-21 LAB — LIPID PANEL WITH LDL/HDL RATIO
Cholesterol, Total: 217 mg/dL — ABNORMAL HIGH (ref 100–199)
HDL: 61 mg/dL (ref >39)
LDL Chol Calc (NIH): 117 mg/dL — ABNORMAL HIGH (ref 0–99)
LDL/HDL Ratio: 1.9 ratio (ref 0.0–3.2)
Triglycerides: 227 mg/dL — ABNORMAL HIGH (ref 0–149)
VLDL Cholesterol Cal: 39 mg/dL (ref 5–40)

## 2020-01-21 LAB — THYROID PANEL WITH TSH
Free Thyroxine Index: 2.2 (ref 1.2–4.9)
T3 Uptake Ratio: 26 % (ref 24–39)
T4, Total: 8.6 ug/dL (ref 4.5–12.0)
TSH: 0.204 u[IU]/mL — ABNORMAL LOW (ref 0.450–4.500)

## 2020-01-21 MED ORDER — LEVOTHYROXINE SODIUM 100 MCG PO TABS: 100.0000 ug | ORAL_TABLET | Freq: Every day | ORAL | 1 refills | Status: DC

## 2020-02-15 ENCOUNTER — Other Ambulatory Visit: Payer: Self-pay | Admitting: Family Medicine

## 2020-02-15 DIAGNOSIS — B001 Herpesviral vesicular dermatitis: Secondary | ICD-10-CM

## 2020-02-15 NOTE — Telephone Encounter (Signed)
Requested Prescriptions  Pending Prescriptions Disp Refills   triamcinolone cream (KENALOG) 0.1 % [Pharmacy Med Name: TRIAMCINOLONE 0.1% CREAM] 30 g 0    Sig: APPLY TO AFFECTED AREA TWICE A DAY     Dermatology:  Corticosteroids Passed - 02/15/2020 10:24 PM      Passed - Valid encounter within last 12 months    Recent Outpatient Visits          3 weeks ago Essential hypertension   [place], MD   1 month ago Acute maxillary sinusitis, recurrence not specified   [facility] Juline Patch, MD   8 months ago Essential hypertension   [place], Deanna C, MD   1 year ago Essential hypertension   [place], Deanna C, MD   1 year ago Essential hypertension   [place], Deanna C, MD      Future Appointments            In 3 weeks Juline Patch, MD [facility], [place]   In 4 months Juline Patch, MD [facility], [facility]

## 2020-02-21 DIAGNOSIS — S82832A Other fracture of upper and lower end of left fibula, initial encounter for closed fracture: Secondary | ICD-10-CM | POA: Diagnosis not present

## 2020-03-10 ENCOUNTER — Encounter: Payer: Self-pay | Admitting: Family Medicine

## 2020-03-10 ENCOUNTER — Ambulatory Visit (INDEPENDENT_AMBULATORY_CARE_PROVIDER_SITE_OTHER): Payer: PRIVATE HEALTH INSURANCE | Admitting: Family Medicine

## 2020-03-10 ENCOUNTER — Other Ambulatory Visit: Payer: Self-pay

## 2020-03-10 VITALS — BP 110/64 | HR 68 | Ht 67.0 in | Wt 146.0 lb

## 2020-03-10 DIAGNOSIS — E039 Hypothyroidism, unspecified: Secondary | ICD-10-CM | POA: Diagnosis not present

## 2020-03-10 DIAGNOSIS — R413 Other amnesia: Secondary | ICD-10-CM

## 2020-03-10 NOTE — Progress Notes (Signed)
Date:  03/10/2020   Name:  Diane Glenn [facility]   DOB:  [birth year]   MRN:  [id number]   Chief Complaint: Hypothyroidism and memory change (has noticed she has to "rely on Joe" remembering things. 2 on 6CIT and 4 on minicog)  Neurologic Problem The patient's primary symptoms include memory loss. The patient's pertinent negatives include no altered mental status, clumsiness, focal sensory loss, focal weakness, loss of balance, near-syncope, slurred speech, syncope, visual change or weakness. This is a chronic problem. The current episode started more than 1 year ago (moreso past year). The problem has been gradually worsening since onset. Pertinent negatives include no abdominal pain, back pain, diaphoresis, dizziness, fatigue, fever, headaches, light-headedness, nausea, palpitations, shortness of breath or vomiting.  Thyroid Problem Presents for follow-up visit. Patient reports no anxiety, cold intolerance, constipation, depressed mood, diaphoresis, diarrhea, dry skin, fatigue, hair loss, heat intolerance, hoarse voice, leg swelling, menstrual problem, nail problem, palpitations, tremors, visual change, weight gain or weight loss.    Lab Results  Component Value Date   CREATININE 0.74 01/20/2020   BUN 8 01/20/2020   NA 136 01/20/2020   K 4.2 01/20/2020   CL 100 01/20/2020   CO2 24 01/20/2020   Lab Results  Component Value Date   CHOL 217 (H) 01/20/2020   HDL 61 01/20/2020   LDLCALC 117 (H) 01/20/2020   TRIG 227 (H) 01/20/2020   CHOLHDL 4.6 (H) 05/30/2017   Lab Results  Component Value Date   TSH 0.204 (L) 01/20/2020   No results found for: HGBA1C Lab Results  Component Value Date   WBC 5.7 12/26/2018   HGB 12.5 12/26/2018   HCT 37.4 12/26/2018   MCV 93.7 12/26/2018   PLT 271 12/26/2018   Lab Results  Component Value Date   ALT 24 10/19/2011   AST 18 10/19/2011   ALKPHOS 87 10/19/2011   BILITOT 0.5 10/19/2011     Review of Systems  Constitutional: Negative.   Negative for chills, diaphoresis, fatigue, fever, unexpected weight change, weight gain and weight loss.  HENT: Negative for congestion, ear discharge, ear pain, hoarse voice, rhinorrhea, sinus pressure, sneezing and sore throat.   Eyes: Negative for photophobia, pain, discharge, redness and itching.  Respiratory: Negative for cough, shortness of breath, wheezing and stridor.   Cardiovascular: Negative for palpitations and near-syncope.  Gastrointestinal: Negative for abdominal pain, blood in stool, constipation, diarrhea, nausea and vomiting.  Endocrine: Negative for cold intolerance, heat intolerance, polydipsia, polyphagia and polyuria.  Genitourinary: Negative for dysuria, flank pain, frequency, hematuria, menstrual problem, pelvic pain, urgency, vaginal bleeding and vaginal discharge.  Musculoskeletal: Negative for arthralgias, back pain and myalgias.  Skin: Negative for rash.  Allergic/Immunologic: Negative for environmental allergies and food allergies.  Neurological: Negative for dizziness, tremors, focal weakness, syncope, weakness, light-headedness, numbness, headaches and loss of balance.  Hematological: Negative for adenopathy. Does not bruise/bleed easily.  Psychiatric/Behavioral: Positive for memory loss. Negative for dysphoric mood. The patient is not nervous/anxious.     Patient Active Problem List   Diagnosis Date Noted  . Pain in limb 07/16/2019  . Neuropathy 06/25/2018  . Seasonal allergic rhinitis due to pollen 06/25/2018  . Age related osteoporosis 12/19/2017  . Encounter for smoking cessation counseling 05/30/2017  . Anxiety 05/30/2017  . Dyslipidemia 05/30/2017  . Nicotine dependence with current use 05/15/2017  . Reactive depression 05/15/2017  . Multiple joint pain 09/29/2016  . Rheumatoid factor positive 09/29/2016  . Essential hypertension 01/27/2015  . Thyroid activity decreased 01/27/2015  .  Restless leg syndrome 01/27/2015  . Degenerative disc disease,  cervical 01/27/2015    No Known Allergies  Past Surgical History:  Procedure Laterality Date  . BREAST BIOPSY Left 02/16/2017   left stereo path pend  . COLONOSCOPY  2013   cleared for 10 yrs- Dr Vira Agar?    Social History   Tobacco Use  . Smoking status: Current Every Day Smoker    Types: Cigarettes  . Smokeless tobacco: Never Used  Substance Use Topics  . Alcohol use: Yes    Alcohol/week: 0.0 standard drinks    Comment: seldom  . Drug use: No     Medication list has been reviewed and updated.  No outpatient medications have been marked as taking for the 03/10/20 encounter (Office Visit) with Juline Patch, MD.    [facility] 2/9 Scores 01/20/2020 06/20/2019 12/26/2018 06/25/2018  PHQ - 2 Score 0 1 0 0  PHQ- 9 Score 0 3 2 2     GAD 7 : Generalized Anxiety Score 01/20/2020 06/20/2019 12/26/2018  Nervous, Anxious, on Edge 0 0 2  Control/stop worrying 0 0 1  Worry too much - different things 0 0 0  Trouble relaxing 0 0 0  Restless 0 0 0  Easily annoyed or irritable 0 1 1  Afraid - awful might happen 0 0 0  Total GAD 7 Score 0 1 4  Anxiety Difficulty - Not difficult at all Not difficult at all    BP Readings from Last 3 Encounters:  03/10/20 110/64  01/20/20 110/70  12/23/19 120/70    Physical Exam Vitals and nursing note reviewed.  Constitutional:      General: She is not in acute distress.    Appearance: She is not diaphoretic.  HENT:     Head: Normocephalic and atraumatic.     Right Ear: Tympanic membrane, ear canal and external ear normal.     Left Ear: Tympanic membrane, ear canal and external ear normal.     Nose: Nose normal. No congestion or rhinorrhea.  Eyes:     General:        Right eye: No discharge.        Left eye: No discharge.     Conjunctiva/sclera: Conjunctivae normal.     Pupils: Pupils are equal, round, and reactive to light.  Neck:     Thyroid: No thyromegaly.     Vascular: No JVD.  Cardiovascular:     Rate and Rhythm: Normal rate and  regular rhythm.     Heart sounds: Normal heart sounds. No murmur heard.  No friction rub. No gallop.   Pulmonary:     Effort: Pulmonary effort is normal.     Breath sounds: Normal breath sounds. No wheezing, rhonchi or rales.  Abdominal:     General: Bowel sounds are normal.     Palpations: Abdomen is soft. There is no mass.     Tenderness: There is no abdominal tenderness. There is no guarding.  Musculoskeletal:        General: Normal range of motion.     Cervical back: Normal range of motion and neck supple.  Lymphadenopathy:     Cervical: No cervical adenopathy.  Skin:    General: Skin is warm and dry.  Neurological:     General: No focal deficit present.     Mental Status: She is alert.     Deep Tendon Reflexes: Reflexes are normal and symmetric.     Wt Readings from Last 3 Encounters:  03/10/20 146  lb (66.2 kg)  01/20/20 146 lb (66.2 kg)  12/23/19 149 lb (67.6 kg)    BP 110/64   Pulse 68   Ht 5\' 7"  (1.702 m)   Wt 146 lb (66.2 kg)   BMI 22.87 kg/m   Assessment and Plan: 1. Hypothyroidism, unspecified type Chronic.  Controlled.  Stable.  Patient has had labs recently TSHs that have varied over the past month from 5.088 months ago to 0.290-month ago.  Patient is on 100 mcg levothyroxine and we discussed gust the possibility that we may have missed a dose or taking an extra dose.  For this reason is been suggested the patient use a pillbox once a week and that way she can go back to determine if she took her medication or not.  In any event we will check her TSH today to see about the range that she is at at the current dosing of levothyroxine. - Thyroid Panel With TSH  2. Memory changes New onset.  Patient over the past year has noticed more more difficulty remembering recent things.  She says that she has to depend on her husband to help her remember things.  Today patient was administered a 6 CIT for which her score was 2 and a mini cog that had a score of 4.  These  are in an unlikely area of dementia but we will have her officially see neurology for discussion if any further evaluation needs to be done and to perhaps reassure her of her current level of cognition. - Ambulatory referral to Neurology

## 2020-03-11 LAB — THYROID PANEL WITH TSH
Free Thyroxine Index: 3.4 (ref 1.2–4.9)
T3 Uptake Ratio: 31 % (ref 24–39)
T4, Total: 11 ug/dL (ref 4.5–12.0)
TSH: 0.032 u[IU]/mL — ABNORMAL LOW (ref 0.450–4.500)

## 2020-03-13 ENCOUNTER — Other Ambulatory Visit: Payer: Self-pay | Admitting: Family Medicine

## 2020-03-13 DIAGNOSIS — E039 Hypothyroidism, unspecified: Secondary | ICD-10-CM

## 2020-03-16 DIAGNOSIS — R200 Anesthesia of skin: Secondary | ICD-10-CM | POA: Diagnosis not present

## 2020-03-17 DIAGNOSIS — S82839A Other fracture of upper and lower end of unspecified fibula, initial encounter for closed fracture: Secondary | ICD-10-CM | POA: Diagnosis not present

## 2020-03-17 DIAGNOSIS — M25572 Pain in left ankle and joints of left foot: Secondary | ICD-10-CM | POA: Diagnosis not present

## 2020-03-17 DIAGNOSIS — M25672 Stiffness of left ankle, not elsewhere classified: Secondary | ICD-10-CM | POA: Diagnosis not present

## 2020-03-17 DIAGNOSIS — M6281 Muscle weakness (generalized): Secondary | ICD-10-CM | POA: Diagnosis not present

## 2020-03-18 ENCOUNTER — Telehealth: Payer: Self-pay | Admitting: Family Medicine

## 2020-03-18 NOTE — Telephone Encounter (Signed)
MAILBOX FULL. Pt due to schedule Medicare Annual Wellness Visit (AWV) either virtually or in office. Whichever the patients preference is.  No history of AWV; please schedule at anytime with [facility] Health Advisor.  This should be a 40 minute visit  AWV-I PER [place] AS OF 05/05/17

## 2020-03-19 DIAGNOSIS — M25572 Pain in left ankle and joints of left foot: Secondary | ICD-10-CM | POA: Diagnosis not present

## 2020-03-19 DIAGNOSIS — M25672 Stiffness of left ankle, not elsewhere classified: Secondary | ICD-10-CM | POA: Diagnosis not present

## 2020-03-19 DIAGNOSIS — S82839A Other fracture of upper and lower end of unspecified fibula, initial encounter for closed fracture: Secondary | ICD-10-CM | POA: Diagnosis not present

## 2020-03-19 DIAGNOSIS — M6281 Muscle weakness (generalized): Secondary | ICD-10-CM | POA: Diagnosis not present

## 2020-04-02 DIAGNOSIS — M25572 Pain in left ankle and joints of left foot: Secondary | ICD-10-CM | POA: Diagnosis not present

## 2020-04-02 DIAGNOSIS — M6281 Muscle weakness (generalized): Secondary | ICD-10-CM | POA: Diagnosis not present

## 2020-04-02 DIAGNOSIS — M25672 Stiffness of left ankle, not elsewhere classified: Secondary | ICD-10-CM | POA: Diagnosis not present

## 2020-04-02 DIAGNOSIS — S82839A Other fracture of upper and lower end of unspecified fibula, initial encounter for closed fracture: Secondary | ICD-10-CM | POA: Diagnosis not present

## 2020-05-22 ENCOUNTER — Other Ambulatory Visit: Payer: Self-pay | Admitting: Family Medicine

## 2020-05-22 DIAGNOSIS — B001 Herpesviral vesicular dermatitis: Secondary | ICD-10-CM

## 2020-07-08 ENCOUNTER — Other Ambulatory Visit: Payer: Self-pay | Admitting: Family Medicine

## 2020-07-08 DIAGNOSIS — I10 Essential (primary) hypertension: Secondary | ICD-10-CM

## 2020-07-08 NOTE — Telephone Encounter (Signed)
  Notes to clinic Pt has appt today, please assess.

## 2020-07-09 ENCOUNTER — Encounter: Payer: Self-pay | Admitting: Family Medicine

## 2020-07-09 ENCOUNTER — Ambulatory Visit (INDEPENDENT_AMBULATORY_CARE_PROVIDER_SITE_OTHER): Payer: Medicare HMO | Admitting: Family Medicine

## 2020-07-09 ENCOUNTER — Other Ambulatory Visit: Payer: Self-pay

## 2020-07-09 VITALS — BP 156/94 | HR 81 | Ht 67.0 in | Wt 145.0 lb

## 2020-07-09 DIAGNOSIS — J301 Allergic rhinitis due to pollen: Secondary | ICD-10-CM | POA: Diagnosis not present

## 2020-07-09 DIAGNOSIS — E039 Hypothyroidism, unspecified: Secondary | ICD-10-CM

## 2020-07-09 DIAGNOSIS — I10 Essential (primary) hypertension: Secondary | ICD-10-CM

## 2020-07-09 DIAGNOSIS — M5030 Other cervical disc degeneration, unspecified cervical region: Secondary | ICD-10-CM | POA: Diagnosis not present

## 2020-07-09 DIAGNOSIS — G2581 Restless legs syndrome: Secondary | ICD-10-CM

## 2020-07-09 MED ORDER — PRAMIPEXOLE DIHYDROCHLORIDE 0.5 MG PO TABS: 0.5000 mg | ORAL_TABLET | Freq: Every day | ORAL | 1 refills | Status: AC

## 2020-07-09 MED ORDER — LEVOTHYROXINE SODIUM 100 MCG PO TABS: 100.0000 ug | ORAL_TABLET | Freq: Every day | ORAL | 1 refills | Status: DC

## 2020-07-09 MED ORDER — CYCLOBENZAPRINE HCL 10 MG PO TABS: ORAL_TABLET | ORAL | 6 refills | Status: AC

## 2020-07-09 MED ORDER — FLUTICASONE PROPIONATE 50 MCG/ACT NA SUSP: NASAL | 3 refills | Status: AC

## 2020-07-09 MED ORDER — METOPROLOL SUCCINATE ER 100 MG PO TB24: 100.0000 mg | ORAL_TABLET | Freq: Every day | ORAL | 1 refills | Status: AC

## 2020-07-09 NOTE — Progress Notes (Signed)
Date:  07/09/2020   Name:  Diane Glenn   DOB:  [birth year]   MRN:  [id number]   Chief Complaint: Hypertension, Hypothyroidism, restless legs, Peripheral Neuropathy, and Allergic Rhinitis   Hypertension This is a chronic problem. The current episode started more than 1 year ago. The problem has been waxing and waning since onset. The problem is controlled. Pertinent negatives include no anxiety, blurred vision, chest pain, headaches, malaise/fatigue, neck pain, orthopnea, palpitations, peripheral edema, PND, shortness of breath or sweats. There are no associated agents to hypertension. Risk factors for coronary artery disease include dyslipidemia and smoking/tobacco exposure. Past treatments include beta blockers. The current treatment provides moderate improvement. There are no compliance problems.  There is no history of angina, kidney disease, CAD/MI, CVA, heart failure, left ventricular hypertrophy, PVD or retinopathy. Identifiable causes of hypertension include a thyroid problem. There is no history of chronic renal disease, a hypertension causing med or renovascular disease.  Thyroid Problem Presents for follow-up visit. Symptoms include fatigue. Patient reports no anxiety, cold intolerance, constipation, depressed mood, diaphoresis, diarrhea, dry skin, hair loss, heat intolerance, hoarse voice, leg swelling, menstrual problem, nail problem, palpitations, tremors, visual change, weight gain or weight loss. The symptoms have been stable. There is no history of heart failure.  Neurologic Problem The patient's pertinent negatives include no altered mental status, clumsiness, focal sensory loss, focal weakness, loss of balance, memory loss, near-syncope, slurred speech, syncope, visual change or weakness. Primary symptoms comment: restless leg/neuropathy. This is a chronic problem. The current episode started more than 1 year ago. The neurological problem developed insidiously. The problem has  been waxing and waning since onset. Associated symptoms include fatigue. Pertinent negatives include no abdominal pain, back pain, chest pain, diaphoresis, dizziness, fever, headaches, nausea, neck pain, palpitations or shortness of breath.  URI  This is a chronic (for allergic rhinitis) problem. The current episode started more than 1 year ago. The problem has been waxing and waning. Pertinent negatives include no abdominal pain, chest pain, coughing, diarrhea, dysuria, ear pain, headaches, nausea, neck pain, rash, sneezing, sore throat or wheezing. She has tried antihistamine (nasal steroid) for the symptoms. The treatment provided moderate relief.    Lab Results  Component Value Date   CREATININE 0.74 01/20/2020   BUN 8 01/20/2020   NA 136 01/20/2020   K 4.2 01/20/2020   CL 100 01/20/2020   CO2 24 01/20/2020   Lab Results  Component Value Date   CHOL 217 (H) 01/20/2020   HDL 61 01/20/2020   LDLCALC 117 (H) 01/20/2020   TRIG 227 (H) 01/20/2020   CHOLHDL 4.6 (H) 05/30/2017   Lab Results  Component Value Date   TSH 0.032 (L) 03/10/2020   No results found for: HGBA1C Lab Results  Component Value Date   WBC 5.7 12/26/2018   HGB 12.5 12/26/2018   HCT 37.4 12/26/2018   MCV 93.7 12/26/2018   PLT 271 12/26/2018   Lab Results  Component Value Date   ALT 24 10/19/2011   AST 18 10/19/2011   ALKPHOS 87 10/19/2011   BILITOT 0.5 10/19/2011     Review of Systems  Constitutional: Positive for fatigue. Negative for chills, diaphoresis, fever, malaise/fatigue, weight gain and weight loss.  HENT: Negative for drooling, ear discharge, ear pain, hoarse voice, sneezing and sore throat.   Eyes: Negative for blurred vision.  Respiratory: Negative for cough, shortness of breath and wheezing.   Cardiovascular: Negative for chest pain, palpitations, orthopnea, leg swelling, PND and near-syncope.  Gastrointestinal: Negative for abdominal pain, blood in stool, constipation, diarrhea and  nausea.  Endocrine: Negative for cold intolerance, heat intolerance and polydipsia.  Genitourinary: Negative for dysuria, frequency, hematuria, menstrual problem and urgency.  Musculoskeletal: Negative for back pain, myalgias and neck pain.  Skin: Negative for rash.  Allergic/Immunologic: Negative for environmental allergies.  Neurological: Negative for dizziness, tremors, focal weakness, syncope, weakness, headaches and loss of balance.  Hematological: Does not bruise/bleed easily.  Psychiatric/Behavioral: Negative for memory loss and suicidal ideas. The patient is not nervous/anxious.     Patient Active Problem List   Diagnosis Date Noted  . Pain in limb 07/16/2019  . Neuropathy 06/25/2018  . Seasonal allergic rhinitis due to pollen 06/25/2018  . Age related osteoporosis 12/19/2017  . Encounter for smoking cessation counseling 05/30/2017  . Anxiety 05/30/2017  . Dyslipidemia 05/30/2017  . Nicotine dependence with current use 05/15/2017  . Reactive depression 05/15/2017  . Multiple joint pain 09/29/2016  . Rheumatoid factor positive 09/29/2016  . Essential hypertension 01/27/2015  . Thyroid activity decreased 01/27/2015  . Restless leg syndrome 01/27/2015  . Degenerative disc disease, cervical 01/27/2015    Allergies  Allergen Reactions  . Levofloxacin Other (See Comments)    Past Surgical History:  Procedure Laterality Date  . BREAST BIOPSY Left 02/16/2017   left stereo path pend  . COLONOSCOPY  2013   cleared for 10 yrs- Dr Vira Agar?    Social History   Tobacco Use  . Smoking status: Current Every Day Smoker    Types: Cigarettes  . Smokeless tobacco: Never Used  Substance Use Topics  . Alcohol use: Yes    Alcohol/week: 0.0 standard drinks    Comment: seldom  . Drug use: No     Medication list has been reviewed and updated.  Current Meds  Medication Sig  . cyclobenzaprine (FLEXERIL) 10 MG tablet One at night  . fluticasone (FLONASE) 50 MCG/ACT nasal  spray 1 spray each nostril daily  . gabapentin (NEURONTIN) 300 MG capsule TAKE 1 CAPSULE (300 MG TOTAL) BY MOUTH 3 (THREE) TIMES DAILY. (Patient taking differently: TAKE 1 CAPSULE (300 MG TOTAL) BY MOUTH 3 (THREE) TIMES DAILY./ Oceanographer)  . levothyroxine (SYNTHROID) 100 MCG tablet TAKE 1 TABLET BY MOUTH EVERY DAY  . metoprolol succinate (TOPROL-XL) 100 MG 24 hr tablet TAKE 1 TABLET BY MOUTH DAILY. TAKE WITH OR IMMEDIATELY FOLLOWING A MEAL.  . pramipexole (MIRAPEX) 0.5 MG tablet Take 1 tablet (0.5 mg total) by mouth daily.  Marland Kitchen rOPINIRole (REQUIP) 0.25 MG tablet Take 1 tablet (0.25 mg total) by mouth at bedtime.  . triamcinolone (KENALOG) 0.1 % APPLY TO AFFECTED AREA TWICE A DAY    PHQ 2/9 Scores 07/09/2020 01/20/2020 06/20/2019 12/26/2018  PHQ - 2 Score 0 0 1 0  PHQ- 9 Score 0 0 3 2    GAD 7 : Generalized Anxiety Score 07/09/2020 01/20/2020 06/20/2019 12/26/2018  Nervous, Anxious, on Edge 1 0 0 2  Control/stop worrying 1 0 0 1  Worry too much - different things 1 0 0 0  Trouble relaxing 0 0 0 0  Restless 0 0 0 0  Easily annoyed or irritable 0 0 1 1  Afraid - awful might happen 0 0 0 0  Total GAD 7 Score 3 0 1 4  Anxiety Difficulty - - Not difficult at all Not difficult at all    BP Readings from Last 3 Encounters:  07/09/20 (!) 156/94  03/10/20 110/64  01/20/20 110/70    Physical Exam  Vitals and nursing note reviewed.  Constitutional:      Appearance: She is well-developed.  HENT:     Head: Normocephalic.     Right Ear: Tympanic membrane, ear canal and external ear normal.     Left Ear: Tympanic membrane, ear canal and external ear normal.     Nose: Nose normal. No congestion or rhinorrhea.     Mouth/Throat:     Mouth: Mucous membranes are moist.  Eyes:     General: Lids are everted, no foreign bodies appreciated. No scleral icterus.       Left eye: No foreign body or hordeolum.     Conjunctiva/sclera: Conjunctivae normal.     Right eye: Right conjunctiva is not injected.      Left eye: Left conjunctiva is not injected.     Pupils: Pupils are equal, round, and reactive to light.  Neck:     Thyroid: No thyromegaly.     Vascular: No JVD.     Trachea: No tracheal deviation.  Cardiovascular:     Rate and Rhythm: Normal rate and regular rhythm.     Heart sounds: Normal heart sounds. No murmur heard. No friction rub. No gallop.   Pulmonary:     Effort: Pulmonary effort is normal. No respiratory distress.     Breath sounds: Normal breath sounds. No wheezing, rhonchi or rales.  Abdominal:     General: Bowel sounds are normal.     Palpations: Abdomen is soft. There is no mass.     Tenderness: There is no abdominal tenderness. There is no guarding or rebound.  Musculoskeletal:        General: No tenderness. Normal range of motion.     Cervical back: Normal range of motion and neck supple.  Lymphadenopathy:     Cervical: No cervical adenopathy.  Skin:    General: Skin is warm.     Findings: No rash.  Neurological:     Mental Status: She is alert and oriented to person, place, and time.     Cranial Nerves: No cranial nerve deficit.     Deep Tendon Reflexes: Reflexes normal.  Psychiatric:        Mood and Affect: Mood is not anxious or depressed.     Wt Readings from Last 3 Encounters:  07/09/20 145 lb (65.8 kg)  03/10/20 146 lb (66.2 kg)  01/20/20 146 lb (66.2 kg)    BP (!) 156/94   Pulse 81   Ht 5\' 7"  (1.702 m)   Wt 145 lb (65.8 kg)   SpO2 98%   BMI 22.71 kg/m   Assessment and Plan: 1. Essential hypertension Chronic.  Controlled.  Stable.  Blood pressure today is 156/94.  Previous readings have been within normal limits and will recheck patient in 6 months in the meantime we will continue metoprolol XL 100 mg once a day.  Will check renal function panel. - metoprolol succinate (TOPROL-XL) 100 MG 24 hr tablet; Take 1 tablet (100 mg total) by mouth daily. TAKE WITH OR IMMEDIATELY FOLLOWING A MEAL.  Dispense: 90 tablet; Refill: 1 - Renal function  panel  2. Hypothyroidism, unspecified type Chronic.  Controlled.  Stable.  Continue levothyroxine 100 mcg daily.  Will check TSH for current level of control. - levothyroxine (SYNTHROID) 100 MCG tablet; Take 1 tablet (100 mcg total) by mouth daily.  Dispense: 90 tablet; Refill: 1 - TSH  3. Restless leg syndrome Chronic.  Controlled.  Stable.  Patient is only taking Mirapex for restless leg  control.  She will continue this 0.5 mg 1 nightly. - pramipexole (MIRAPEX) 0.5 MG tablet; Take 1 tablet (0.5 mg total) by mouth daily.  Dispense: 90 tablet; Refill: 1  4. Seasonal allergic rhinitis due to pollen Chronic.  Seasonal.  Presently controlled and uncomplicated.  We will continue fluticasone 50 mcg nasal spray 1 spray each nostril daily. - fluticasone (FLONASE) 50 MCG/ACT nasal spray; 1 spray each nostril daily  Dispense: 48 mL; Refill: 3  5. Degenerative disc disease, cervical Patient continues to have lumbar disc discomfort and has been instructed to use Flexeril 10 mg nightly for muscle spasms. - cyclobenzaprine (FLEXERIL) 10 MG tablet; One at night  Dispense: 30 tablet; Refill: 6

## 2020-07-10 ENCOUNTER — Other Ambulatory Visit: Payer: Self-pay

## 2020-07-10 DIAGNOSIS — E039 Hypothyroidism, unspecified: Secondary | ICD-10-CM

## 2020-07-10 LAB — RENAL FUNCTION PANEL
Albumin: 4.3 g/dL (ref 3.8–4.8)
BUN/Creatinine Ratio: 11 — ABNORMAL LOW (ref 12–28)
BUN: 7 mg/dL — ABNORMAL LOW (ref 8–27)
CO2: 22 mmol/L (ref 20–29)
Calcium: 9.6 mg/dL (ref 8.7–10.3)
Chloride: 101 mmol/L (ref 96–106)
Creatinine, Ser: 0.64 mg/dL (ref 0.57–1.00)
Glucose: 99 mg/dL (ref 65–99)
Phosphorus: 3.5 mg/dL (ref 3.0–4.3)
Potassium: 4.8 mmol/L (ref 3.5–5.2)
Sodium: 143 mmol/L (ref 134–144)
eGFR: 96 mL/min/{1.73_m2} (ref >59)

## 2020-07-10 LAB — TSH: TSH: 0.112 u[IU]/mL — ABNORMAL LOW (ref 0.450–4.500)

## 2020-07-10 MED ORDER — LEVOTHYROXINE SODIUM 88 MCG PO TABS: 88.0000 ug | ORAL_TABLET | Freq: Every day | ORAL | 1 refills | Status: DC

## 2020-08-10 DIAGNOSIS — M79675 Pain in left toe(s): Secondary | ICD-10-CM | POA: Diagnosis not present

## 2020-08-10 DIAGNOSIS — G2581 Restless legs syndrome: Secondary | ICD-10-CM | POA: Diagnosis not present

## 2020-08-10 DIAGNOSIS — M62838 Other muscle spasm: Secondary | ICD-10-CM | POA: Diagnosis not present

## 2020-08-10 DIAGNOSIS — R270 Ataxia, unspecified: Secondary | ICD-10-CM | POA: Diagnosis not present

## 2020-08-11 ENCOUNTER — Other Ambulatory Visit: Payer: Self-pay | Admitting: Family Medicine

## 2020-08-11 DIAGNOSIS — B001 Herpesviral vesicular dermatitis: Secondary | ICD-10-CM

## 2020-08-17 ENCOUNTER — Telehealth: Payer: Self-pay

## 2020-08-17 NOTE — Telephone Encounter (Signed)
Left a message on pt's phone to see if she WANTS to get the bone density- if so, I will order it

## 2020-08-17 NOTE — Telephone Encounter (Signed)
Copied from [place] [phone]. Topic: General - Other >> Aug 17, 2020 12:30 PM Leward Quan A wrote: Reason for CRM: April with [facility] called in to inquire of Dr Ronnald Ramp about patient getting a Bone Density test the paper work was faxed over on 08/03/20. Will fax paperwork again today but if there are any questions please call Ph# [phone]

## 2020-09-22 ENCOUNTER — Other Ambulatory Visit: Payer: Self-pay | Admitting: Family Medicine

## 2020-09-22 DIAGNOSIS — G2581 Restless legs syndrome: Secondary | ICD-10-CM

## 2020-09-22 DIAGNOSIS — B001 Herpesviral vesicular dermatitis: Secondary | ICD-10-CM

## 2020-11-02 ENCOUNTER — Encounter: Payer: Self-pay | Admitting: Family Medicine

## 2020-11-24 ENCOUNTER — Other Ambulatory Visit: Payer: Self-pay | Admitting: Family Medicine

## 2020-11-24 ENCOUNTER — Encounter: Payer: Self-pay | Admitting: Family Medicine

## 2020-11-24 DIAGNOSIS — E039 Hypothyroidism, unspecified: Secondary | ICD-10-CM

## 2020-11-24 DIAGNOSIS — G2581 Restless legs syndrome: Secondary | ICD-10-CM

## 2020-11-24 DIAGNOSIS — B001 Herpesviral vesicular dermatitis: Secondary | ICD-10-CM

## 2020-11-24 NOTE — Telephone Encounter (Signed)
  Notes to clinic:  Patient has appt on 12/22/2020 Review for refill until that time    Requested Prescriptions  Pending Prescriptions Disp Refills   levothyroxine (SYNTHROID) 88 MCG tablet [Pharmacy Med Name: LEVOTHYROXINE 88 MCG TABLET] 90 tablet 1    Sig: TAKE 1 TABLET BY MOUTH EVERY DAY     Endocrinology:  Hypothyroid Agents Failed - 11/24/2020  9:47 AM      Failed - TSH needs to be rechecked within 3 months after an abnormal result. Refill until TSH is due.      Failed - TSH in normal range and within 360 days    TSH  Date Value Ref Range Status  07/09/2020 0.112 (L) 0.450 - 4.500 uIU/mL Final          Passed - Valid encounter within last 12 months    Recent Outpatient Visits           4 months ago Essential hypertension   [facility] Juline Patch, MD   8 months ago Hypothyroidism, unspecified type   [facility] Juline Patch, MD   10 months ago Essential hypertension   [facility] Juline Patch, MD   11 months ago Acute maxillary sinusitis, recurrence not specified   [facility] Juline Patch, MD   1 year ago Essential hypertension   [facility] Juline Patch, MD       Future Appointments             In 4 weeks Juline Patch, MD [facility]             rOPINIRole (REQUIP) 0.25 MG tablet [Pharmacy Med Name: ROPINIROLE HCL 0.25 MG TABLET] 90 tablet 0    Sig: TAKE 1 TABLET BY MOUTH EVERYDAY AT BEDTIME     Neurology:  Parkinsonian Agents Failed - 11/24/2020  9:47 AM      Failed - Last BP in normal range    BP Readings from Last 1 Encounters:  07/09/20 (!) 156/94          Passed - Valid encounter within last 12 months    Recent Outpatient Visits           4 months ago Essential hypertension   [place], MD   8 months ago Hypothyroidism, unspecified type   [facility] Juline Patch, MD   10 months ago Essential hypertension   [facility] Juline Patch, MD   11 months ago Acute maxillary sinusitis, recurrence not specified   [facility] Juline Patch, MD   1 year ago Essential hypertension   [place], Deanna C, MD       Future Appointments             In 4 weeks Juline Patch, MD [facility]

## 2020-12-14 DIAGNOSIS — R270 Ataxia, unspecified: Secondary | ICD-10-CM | POA: Diagnosis not present

## 2020-12-14 DIAGNOSIS — M62838 Other muscle spasm: Secondary | ICD-10-CM | POA: Diagnosis not present

## 2020-12-14 DIAGNOSIS — G2581 Restless legs syndrome: Secondary | ICD-10-CM | POA: Diagnosis not present

## 2020-12-22 ENCOUNTER — Ambulatory Visit: Payer: PRIVATE HEALTH INSURANCE | Admitting: Family Medicine

## 2021-01-05 ENCOUNTER — Other Ambulatory Visit: Payer: Self-pay | Admitting: Internal Medicine

## 2021-01-05 DIAGNOSIS — E039 Hypothyroidism, unspecified: Secondary | ICD-10-CM

## 2021-01-05 NOTE — Telephone Encounter (Signed)
Requested medication (s) are due for refill today: yes  Requested medication (s) are on the active medication list: yes  Last refill:  11/24/20 #30 0 refills  Future visit scheduled: no  Notes to clinic:  need recheck for TSH due 10/08/20. Do you want to refill Rx?     Requested Prescriptions  Pending Prescriptions Disp Refills   levothyroxine (SYNTHROID) 88 MCG tablet [Pharmacy Med Name: LEVOTHYROXINE 88 MCG TABLET] 30 tablet 0    Sig: TAKE 1 TABLET BY MOUTH EVERY DAY     Endocrinology:  Hypothyroid Agents Failed - 01/05/2021 12:31 PM      Failed - TSH needs to be rechecked within 3 months after an abnormal result. Refill until TSH is due.      Failed - TSH in normal range and within 360 days    TSH  Date Value Ref Range Status  07/09/2020 0.112 (L) 0.450 - 4.500 uIU/mL Final          Passed - Valid encounter within last 12 months    Recent Outpatient Visits           6 months ago Essential hypertension   [place], Deanna C, MD   10 months ago Hypothyroidism, unspecified type   [facility] Juline Patch, MD   11 months ago Essential hypertension   [facility] Juline Patch, MD   1 year ago Acute maxillary sinusitis, recurrence not specified   [facility] Juline Patch, MD   1 year ago Essential hypertension   [facility] Juline Patch, MD

## 2021-01-06 NOTE — Telephone Encounter (Signed)
Medication refill

## 2021-01-08 DIAGNOSIS — Z720 Tobacco use: Secondary | ICD-10-CM | POA: Diagnosis not present

## 2021-01-08 DIAGNOSIS — Z7689 Persons encountering health services in other specified circumstances: Secondary | ICD-10-CM | POA: Diagnosis not present

## 2021-01-08 DIAGNOSIS — E039 Hypothyroidism, unspecified: Secondary | ICD-10-CM | POA: Diagnosis not present

## 2021-01-08 DIAGNOSIS — I10 Essential (primary) hypertension: Secondary | ICD-10-CM | POA: Diagnosis not present

## 2021-01-08 DIAGNOSIS — F32A Depression, unspecified: Secondary | ICD-10-CM | POA: Diagnosis not present

## 2021-01-08 DIAGNOSIS — R42 Dizziness and giddiness: Secondary | ICD-10-CM | POA: Diagnosis not present

## 2021-01-08 DIAGNOSIS — Z1211 Encounter for screening for malignant neoplasm of colon: Secondary | ICD-10-CM | POA: Diagnosis not present

## 2021-01-08 DIAGNOSIS — Z1231 Encounter for screening mammogram for malignant neoplasm of breast: Secondary | ICD-10-CM | POA: Diagnosis not present

## 2021-01-08 DIAGNOSIS — Z0000 Encounter for general adult medical examination without abnormal findings: Secondary | ICD-10-CM | POA: Diagnosis not present

## 2021-01-08 DIAGNOSIS — Z23 Encounter for immunization: Secondary | ICD-10-CM | POA: Diagnosis not present

## 2021-01-08 DIAGNOSIS — Z122 Encounter for screening for malignant neoplasm of respiratory organs: Secondary | ICD-10-CM | POA: Diagnosis not present

## 2021-01-08 DIAGNOSIS — E782 Mixed hyperlipidemia: Secondary | ICD-10-CM | POA: Diagnosis not present

## 2021-01-08 DIAGNOSIS — R7309 Other abnormal glucose: Secondary | ICD-10-CM | POA: Diagnosis not present

## 2021-01-11 ENCOUNTER — Other Ambulatory Visit: Payer: Self-pay | Admitting: Family Medicine

## 2021-01-11 DIAGNOSIS — E039 Hypothyroidism, unspecified: Secondary | ICD-10-CM

## 2021-01-11 DIAGNOSIS — I10 Essential (primary) hypertension: Secondary | ICD-10-CM

## 2021-01-11 NOTE — Telephone Encounter (Signed)
Requested medication (s) are due for refill today: Yes  Requested medication (s) are on the active medication list: Yes  Last refill:  1 month ago  Future visit scheduled: Yes  Notes to clinic:  Unable to refill per protocol due to failed labs, no updated thyroid level, noted to recheck in 6-8 weeks since last labs 07/2020, sent MyChart message to patient.     Requested Prescriptions  Pending Prescriptions Disp Refills   levothyroxine (SYNTHROID) 88 MCG tablet 30 tablet 0    Sig: Take 1 tablet (88 mcg total) by mouth daily.     Endocrinology:  Hypothyroid Agents Failed - 01/11/2021  6:47 PM      Failed - TSH needs to be rechecked within 3 months after an abnormal result. Refill until TSH is due.      Failed - TSH in normal range and within 360 days    TSH  Date Value Ref Range Status  07/09/2020 0.112 (L) 0.450 - 4.500 uIU/mL Final          Passed - Valid encounter within last 12 months    Recent Outpatient Visits           6 months ago Essential hypertension   [place], Deanna C, MD   10 months ago Hypothyroidism, unspecified type   [facility] Juline Patch, MD   11 months ago Essential hypertension   [facility] Juline Patch, MD   1 year ago Acute maxillary sinusitis, recurrence not specified   [facility] Juline Patch, MD   1 year ago Essential hypertension   [facility] Juline Patch, MD

## 2021-01-11 NOTE — Telephone Encounter (Signed)
Patient called, no answer, mailbox full, unable to leave VM to return the call to the office for follow up appointment.

## 2021-01-11 NOTE — Telephone Encounter (Signed)
Copied from [place] [id number]. Topic: Quick Communication - Rx Refill/Question >> Jan 11, 2021  5:29 PM Yvette Rack wrote: Medication: levothyroxine (SYNTHROID) 88 MCG tablet  Has the patient contacted their pharmacy? Yes.   (Agent: If no, request that the patient contact the pharmacy for the refill.) (Agent: If yes, when and what did the pharmacy advise?)  Preferred Pharmacy (with phone number or street name): [facility] ([facility]) - [place], [place]  Phone: [phone]   Fax: [phone]  Has the patient been seen for an appointment in the last year OR does the patient have an upcoming appointment? Yes.    Agent: Please be advised that RX refills may take up to 3 business days. We ask that you follow-up with your pharmacy.

## 2021-01-11 NOTE — Telephone Encounter (Signed)
Requested medication (s) are due for refill today: Yes  Requested medication (s) are on the active medication list: Yes  Last refill:  6 months ago  Future visit scheduled: No  Notes to clinic: Unable to refill per protocol, appointment needed.       Requested Prescriptions  Pending Prescriptions Disp Refills   metoprolol succinate (TOPROL-XL) 100 MG 24 hr tablet 90 tablet 1    Sig: Take 1 tablet (100 mg total) by mouth daily. TAKE WITH OR IMMEDIATELY FOLLOWING A MEAL.     Cardiovascular:  Beta Blockers Failed - 01/11/2021  6:37 PM      Failed - Last BP in normal range    BP Readings from Last 1 Encounters:  07/09/20 (!) 156/94          Failed - Valid encounter within last 6 months    Recent Outpatient Visits           6 months ago Essential hypertension   [place], Deanna C, MD   10 months ago Hypothyroidism, unspecified type   [facility] Juline Patch, MD   11 months ago Essential hypertension   [facility] Juline Patch, MD   1 year ago Acute maxillary sinusitis, recurrence not specified   [facility] Juline Patch, MD   1 year ago Essential hypertension   [facility] Juline Patch, MD              Passed - Last Heart Rate in normal range    Pulse Readings from Last 1 Encounters:  07/09/20 81

## 2021-01-11 NOTE — Telephone Encounter (Signed)
Patient called, no answer, mailbox is full. Noted to return in 6-8 weeks for thyroid labs. Sent MyChart message to stop by the office asap for labs. Routing to provider for approval.

## 2021-01-11 NOTE — Telephone Encounter (Signed)
Needs verbal approval to dispense  Best contact: [phone] Reference : [id number]  metoprolol succinate (TOPROL-XL) 100 MG 24 hr tablet  [facility] ([facility] - [place], [address]  [place] [address]  Phone: [phone] Fax: [phone]

## 2021-01-28 DIAGNOSIS — J301 Allergic rhinitis due to pollen: Secondary | ICD-10-CM | POA: Diagnosis not present

## 2021-01-28 DIAGNOSIS — H9319 Tinnitus, unspecified ear: Secondary | ICD-10-CM | POA: Diagnosis not present

## 2021-01-28 DIAGNOSIS — R42 Dizziness and giddiness: Secondary | ICD-10-CM | POA: Diagnosis not present

## 2021-02-10 DIAGNOSIS — K1370 Unspecified lesions of oral mucosa: Secondary | ICD-10-CM | POA: Diagnosis not present

## 2021-02-18 DIAGNOSIS — Z1231 Encounter for screening mammogram for malignant neoplasm of breast: Secondary | ICD-10-CM | POA: Diagnosis not present

## 2021-02-18 DIAGNOSIS — N6311 Unspecified lump in the right breast, upper outer quadrant: Secondary | ICD-10-CM | POA: Diagnosis not present

## 2021-02-18 DIAGNOSIS — Z1239 Encounter for other screening for malignant neoplasm of breast: Secondary | ICD-10-CM | POA: Diagnosis not present

## 2021-02-23 ENCOUNTER — Other Ambulatory Visit: Payer: Self-pay | Admitting: *Deleted

## 2021-02-23 DIAGNOSIS — E039 Hypothyroidism, unspecified: Secondary | ICD-10-CM | POA: Diagnosis not present

## 2021-02-23 DIAGNOSIS — Z87891 Personal history of nicotine dependence: Secondary | ICD-10-CM

## 2021-02-23 DIAGNOSIS — F17210 Nicotine dependence, cigarettes, uncomplicated: Secondary | ICD-10-CM

## 2021-03-03 ENCOUNTER — Other Ambulatory Visit: Payer: Self-pay

## 2021-03-03 DIAGNOSIS — E039 Hypothyroidism, unspecified: Secondary | ICD-10-CM

## 2021-03-08 DIAGNOSIS — N63 Unspecified lump in unspecified breast: Secondary | ICD-10-CM | POA: Diagnosis not present

## 2021-03-08 DIAGNOSIS — R928 Other abnormal and inconclusive findings on diagnostic imaging of breast: Secondary | ICD-10-CM | POA: Diagnosis not present

## 2021-03-10 ENCOUNTER — Other Ambulatory Visit: Payer: Self-pay

## 2021-03-10 ENCOUNTER — Encounter: Payer: Self-pay | Admitting: Primary Care

## 2021-03-10 ENCOUNTER — Ambulatory Visit
Admission: RE | Admit: 2021-03-10 | Discharge: 2021-03-10 | Disposition: A | Discharge status: Home / Self Care | Payer: 59 | Source: Ambulatory Visit | Attending: Acute Care | Admitting: Acute Care

## 2021-03-10 ENCOUNTER — Ambulatory Visit (INDEPENDENT_AMBULATORY_CARE_PROVIDER_SITE_OTHER): Payer: Medicare Other | Admitting: Primary Care

## 2021-03-10 DIAGNOSIS — F17210 Nicotine dependence, cigarettes, uncomplicated: Secondary | ICD-10-CM

## 2021-03-10 DIAGNOSIS — Z87891 Personal history of nicotine dependence: Secondary | ICD-10-CM | POA: Diagnosis not present

## 2021-03-10 DIAGNOSIS — F17200 Nicotine dependence, unspecified, uncomplicated: Secondary | ICD-10-CM

## 2021-03-10 NOTE — Progress Notes (Signed)
Shared Decision Making Visit Lung Cancer Screening Program [phone])   Eligibility: Age 70 y.o. Pack Years Smoking History Calculation 50 pack years  (# packs/per year x # years smoked) Recent History of coughing up blood  no Unexplained weight loss? no ( >Than 15 pounds within the last 6 months ) Prior History Lung / other cancer no (Diagnosis within the last 5 years already requiring surveillance chest CT Scans). Smoking Status Current Smoker Former Smokers: Years since quit: < 1 year  Quit Date: NA  Visit Components: Discussion included one or more decision making aids. yes Discussion included risk/benefits of screening. yes Discussion included potential follow up diagnostic testing for abnormal scans. yes Discussion included meaning and risk of over diagnosis. yes Discussion included meaning and risk of False Positives. yes Discussion included meaning of total radiation exposure. yes  Counseling Included: Importance of adherence to annual lung cancer LDCT screening. yes Impact of comorbidities on ability to participate in the program. yes Ability and willingness to under diagnostic treatment. yes  Smoking Cessation Counseling: Current Smokers:  Discussed importance of smoking cessation. yes Information about tobacco cessation classes and interventions provided to patient. yes Patient provided with "ticket" for LDCT Scan. NA Symptomatic Patient. no  Counseling(Intermediate counseling: > three minutes) 99406 Diagnosis Code: Tobacco Use Z72.0 Asymptomatic Patient yes  Counseling (Intermediate counseling: > three minutes counseling) N8676 Former Smokers:  Discussed the importance of maintaining cigarette abstinence. yes Diagnosis Code: Personal History of Nicotine Dependence. H20.947 Information about tobacco cessation classes and interventions provided to patient. Yes Patient provided with "ticket" for LDCT Scan. NA Written Order for Lung Cancer Screening with LDCT placed  in Epic. Yes (CT Chest Lung Cancer Screening Low Dose W/O CM) [id number] Z12.2-Screening of respiratory organs Z87.891-Personal history of nicotine dependence  I have spent 25 minutes of virtual visit time with Diane Glenn discussing the risks and benefits of lung cancer screening. We viewed / discussed a power point together that explained in detail the above noted topics. We paused at intervals to allow for questions to be asked and answered to ensure understanding.We discussed that the single most powerful action that she can take to decrease her risk of developing lung cancer is to quit smoking. We discussed whether or not she is ready to commit to setting a quit date. We discussed options for tools to aid in quitting smoking including nicotine replacement therapy, non-nicotine medications, support groups, Quit Smart classes, and behavior modification. We discussed that often times setting smaller, more achievable goals, such as eliminating 1 cigarette a day for a week and then 2 cigarettes a day for a week can be helpful in slowly decreasing the number of cigarettes smoked. This allows for a sense of accomplishment as well as providing a clinical benefit. I provided her  with smoking cessation  information  with contact information for community resources, classes, free nicotine replacement therapy, and access to mobile apps, text messaging, and on-line smoking cessation help. I have also provided her  the office contact information in the event she needs to contact me, or the screening staff. We discussed the time and location of the scan, and that either Diane Glassman RN, Diane Prince, RN  or I will call / send a letter with the results within 24-72 hours of receiving them. The patient verbalized understanding of all of  the above and had no further questions upon leaving the office. They have my contact information in the event they have any further questions.  I spent  3-5 minutes counseling on smoking  cessation and the health risks of continued tobacco abuse.  I explained to the patient that there has been a high incidence of coronary artery disease noted on these exams. I explained that this is a non-gated exam therefore degree or severity cannot be determined. This patient is not on statin therapy. I have asked the patient to follow-up with their PCP regarding any incidental finding of coronary artery disease and management with diet or medication as their PCP  feels is clinically indicated. The patient verbalized understanding of the above and had no further questions upon completion of the visit.    Martyn Ehrich, NP

## 2021-03-10 NOTE — Patient Instructions (Signed)
Thank you for participating in the [facility]. °It was our pleasure to meet you today. °We will call you with the results of your scan within the next few days. °Your scan will be assigned a Lung RADS category score by the physicians reading the scans.  °This Lung RADS score determines follow up scanning.  °See below for description of categories, and follow up screening recommendations. °We will be in touch to schedule your follow up screening annually or based on recommendations of our providers. °We will fax a copy of your scan results to your Primary Care Physician, or the physician who referred you to the program, to ensure they have the results. °Please call the office if you have any questions or concerns regarding your scanning experience or results.  °Our office number is [phone]. °Please speak with Denise Phelps, RN. She is our Lung Cancer Screening RN. °If she is unavailable when you call, please have the office staff send her a message. She will return your call at her earliest convenience. °Remember, if your scan is normal, we will scan you annually as long as you continue to meet the criteria for the program. (Age 55-77, Current smoker or smoker who has quit within the last 15 years). °If you are a smoker, remember, quitting is the single most powerful action that you can take to decrease your risk of lung cancer and other pulmonary, breathing related problems. °We know quitting is hard, and we are here to help.  °Please let us know if there is anything we can do to help you meet your goal of quitting. °If you are a former smoker, congratulations. We are proud of you! Remain smoke free! °Remember you can refer friends or family members through the number above.  °We will screen them to make sure they meet criteria for the program. °Thank you for helping us take better care of you by participating in Lung Screening. ° °You can receive free nicotine replacement therapy  ( patches, gum or mints) by calling 1-800-QUIT NOW. Please call so we can get you on the path to becoming  a non-smoker. I know it is hard, but you can do this! ° °Lung RADS Categories: ° °Lung RADS 1: no nodules or definitely non-concerning nodules.  °Recommendation is for a repeat annual scan in 12 months. ° °Lung RADS 2:  nodules that are non-concerning in appearance and behavior with a very low likelihood of becoming an active cancer. °Recommendation is for a repeat annual scan in 12 months. ° °Lung RADS 3: nodules that are probably non-concerning , includes nodules with a low likelihood of becoming an active cancer.  Recommendation is for a 6-month repeat screening scan. Often noted after an upper respiratory illness. We will be in touch to make sure you have no questions, and to schedule your 6-month scan. ° °Lung RADS 4 A: nodules with concerning findings, recommendation is most often for a follow up scan in 3 months or additional testing based on our provider's assessment of the scan. We will be in touch to make sure you have no questions and to schedule the recommended 3 month follow up scan. ° °Lung RADS 4 B:  indicates findings that are concerning. We will be in touch with you to schedule additional diagnostic testing based on our provider's  assessment of the scan. ° °Hypnosis for smoking cessation  °[employer] °[phone] ° °Acupuncture for smoking cessation  °[facility] °[phone]  °

## 2021-03-25 ENCOUNTER — Other Ambulatory Visit: Payer: Self-pay | Admitting: Acute Care

## 2021-03-25 DIAGNOSIS — F17210 Nicotine dependence, cigarettes, uncomplicated: Secondary | ICD-10-CM

## 2021-03-25 DIAGNOSIS — Z87891 Personal history of nicotine dependence: Secondary | ICD-10-CM

## 2021-04-20 ENCOUNTER — Telehealth: Payer: Self-pay | Admitting: Acute Care

## 2021-04-20 NOTE — Telephone Encounter (Signed)
Patient had left voicemail regarding LCS referral.  Patient has had initial LCS CT.  Attempted to return call twice but no answer and unable to leave message.  VM full.

## 2021-05-20 ENCOUNTER — Other Ambulatory Visit: Payer: Self-pay | Admitting: Family Medicine

## 2021-05-20 DIAGNOSIS — I10 Essential (primary) hypertension: Secondary | ICD-10-CM

## 2021-05-31 ENCOUNTER — Other Ambulatory Visit: Payer: Self-pay | Admitting: Family Medicine

## 2021-05-31 DIAGNOSIS — R42 Dizziness and giddiness: Secondary | ICD-10-CM

## 2021-06-12 ENCOUNTER — Other Ambulatory Visit: Payer: Self-pay

## 2021-06-12 ENCOUNTER — Ambulatory Visit
Admission: RE | Admit: 2021-06-12 | Discharge: 2021-06-12 | Disposition: A | Discharge status: Home / Self Care | Payer: BC Managed Care – PPO | Source: Ambulatory Visit | Attending: Family Medicine | Admitting: Family Medicine

## 2021-06-12 DIAGNOSIS — R42 Dizziness and giddiness: Secondary | ICD-10-CM | POA: Insufficient documentation

## 2021-06-12 MED ORDER — GADOBUTROL 1 MMOL/ML IV SOLN
7.0000 mL | Freq: Once | INTRAVENOUS | Status: AC | PRN
  Administered 2021-06-12: 7.5 mL via INTRAVENOUS

## 2021-08-31 ENCOUNTER — Ambulatory Visit (INDEPENDENT_AMBULATORY_CARE_PROVIDER_SITE_OTHER): Payer: BC Managed Care – PPO

## 2021-08-31 ENCOUNTER — Ambulatory Visit
Admission: EM | Admit: 2021-08-31 | Discharge: 2021-08-31 | Disposition: A | Discharge status: Home / Self Care | Payer: BC Managed Care – PPO | Attending: Internal Medicine | Admitting: Internal Medicine

## 2021-08-31 DIAGNOSIS — M109 Gout, unspecified: Secondary | ICD-10-CM | POA: Diagnosis present

## 2021-08-31 DIAGNOSIS — M79672 Pain in left foot: Secondary | ICD-10-CM

## 2021-08-31 LAB — CBC WITH DIFFERENTIAL/PLATELET
Abs Immature Granulocytes: 0.04 10*3/uL (ref 0.00–0.07)
Basophils Absolute: 0 10*3/uL (ref 0.0–0.1)
Basophils Relative: 0 %
Eosinophils Absolute: 0 10*3/uL (ref 0.0–0.5)
Eosinophils Relative: 0 %
HCT: 36.1 % (ref 36.0–46.0)
Hemoglobin: 12.4 g/dL (ref 12.0–15.0)
Immature Granulocytes: 1 %
Lymphocytes Relative: 9 %
Lymphs Abs: 0.7 10*3/uL (ref 0.7–4.0)
MCH: 31.9 pg (ref 26.0–34.0)
MCHC: 34.3 g/dL (ref 30.0–36.0)
MCV: 92.8 fL (ref 80.0–100.0)
Monocytes Absolute: 0.2 10*3/uL (ref 0.1–1.0)
Monocytes Relative: 3 %
Neutro Abs: 7.5 10*3/uL (ref 1.7–7.7)
Neutrophils Relative %: 87 %
Platelets: 261 10*3/uL (ref 150–400)
RBC: 3.89 MIL/uL (ref 3.87–5.11)
RDW: 13.2 % (ref 11.5–15.5)
WBC: 8.6 10*3/uL (ref 4.0–10.5)
nRBC: 0 % (ref 0.0–0.2)

## 2021-08-31 LAB — URIC ACID: Uric Acid, Serum: 7.4 mg/dL — ABNORMAL HIGH (ref 2.5–7.1)

## 2021-08-31 MED ORDER — COLCHICINE 0.6 MG PO TABS: ORAL_TABLET | ORAL | 0 refills | Status: AC

## 2021-08-31 MED ORDER — INDOMETHACIN 50 MG PO CAPS: 50.0000 mg | ORAL_CAPSULE | Freq: Two times a day (BID) | ORAL | 0 refills | Status: AC

## 2021-08-31 NOTE — ED Provider Notes (Signed)
[facility]    CSN: [id number] Arrival date & time: 08/31/21  1705      History   Chief Complaint Chief Complaint  Patient presents with   Foot Pain    Left     HPI Diane Glenn is a 71 y.o. female who presents with sudden onset of L foot pain and swelling 2 days ago. She has memory problems and does not recall if she injured this area or not. Denies hx of gout but her brother gets them.Denies eating cold cuts or seafood prior to onset of this.      Past Medical History:  Diagnosis Date   Hypertension    Restless leg syndrome    Thyroid disease     Patient Active Problem List   Diagnosis Date Noted   Pain in limb 07/16/2019   Neuropathy 06/25/2018   Seasonal allergic rhinitis due to pollen 06/25/2018   Age related osteoporosis 12/19/2017   Encounter for smoking cessation counseling 05/30/2017   Anxiety 05/30/2017   Dyslipidemia 05/30/2017   Nicotine dependence with current use 05/15/2017   Reactive depression 05/15/2017   Multiple joint pain 09/29/2016   Rheumatoid factor positive 09/29/2016   Essential hypertension 01/27/2015   Thyroid activity decreased 01/27/2015   Restless leg syndrome 01/27/2015   Degenerative disc disease, cervical 01/27/2015    Past Surgical History:  Procedure Laterality Date   BREAST BIOPSY Left 02/16/2017   left stereo path pend   COLONOSCOPY  2013   cleared for 10 yrs- Dr Vira Agar?    OB History   No obstetric history on file.      Home Medications    Prior to Admission medications   Medication Sig Start Date End Date Taking? Authorizing Provider  colchicine 0.6 MG tablet Take 2 right away then in one hour take one for gout 08/31/21  Yes Rodriguez-Southworth, Sunday Spillers, PA-C  cyclobenzaprine (FLEXERIL) 10 MG tablet One at night 07/09/20  Yes Juline Patch, MD  fluticasone (FLONASE) 50 MCG/ACT nasal spray 1 spray each nostril daily 07/09/20  Yes Juline Patch, MD  gabapentin (NEURONTIN) 300 MG capsule TAKE 1  CAPSULE (300 MG TOTAL) BY MOUTH 3 (THREE) TIMES DAILY. Patient taking differently: TAKE 1 CAPSULE (300 MG TOTAL) BY MOUTH 3 (THREE) TIMES DAILY./ neuro potter 06/20/19  Yes Juline Patch, MD  indomethacin (INDOCIN) 50 MG capsule Take 1 capsule (50 mg total) by mouth 2 (two) times daily with a meal. For pain and inflammation 08/31/21  Yes Rodriguez-Southworth, Sunday Spillers, PA-C  levothyroxine (SYNTHROID) 50 MCG tablet Take by mouth. 03/02/21 03/02/22 Yes [provider]  levothyroxine (SYNTHROID) 88 MCG tablet TAKE 1 TABLET BY MOUTH EVERY DAY 11/24/20  Yes Glean Hess, MD  metoprolol succinate (TOPROL-XL) 100 MG 24 hr tablet Take 1 tablet (100 mg total) by mouth daily. TAKE WITH OR IMMEDIATELY FOLLOWING A MEAL. 07/09/20  Yes Juline Patch, MD  pramipexole (MIRAPEX) 0.5 MG tablet Take 1 tablet (0.5 mg total) by mouth daily. 07/09/20  Yes Juline Patch, MD  pramipexole (MIRAPEX) 1 MG tablet Take by mouth. 03/02/21  Yes [provider]  rOPINIRole (REQUIP) 0.25 MG tablet TAKE 1 TABLET BY MOUTH EVERYDAY AT BEDTIME 11/24/20  Yes Glean Hess, MD  triamcinolone cream (KENALOG) 0.1 % APPLY TO AFFECTED AREA TWICE A DAY 11/24/20  Yes Juline Patch, MD    Family History Family History  Problem Relation Age of Onset   Diabetes Father    Stroke Father  Breast cancer Neg Hx     Social History Social History   Tobacco Use   Smoking status: Every Day    Types: Cigarettes   Smokeless tobacco: Never  Substance Use Topics   Alcohol use: Yes    Alcohol/week: 0.0 standard drinks    Comment: seldom   Drug use: No     Allergies   Levofloxacin   Review of Systems Review of Systems  Musculoskeletal:  Positive for gait problem and joint swelling.  Skin:  Positive for color change. Negative for pallor, rash and wound.  Neurological:  Negative for numbness.    Physical Exam Triage Vital Signs ED Triage Vitals  Enc Vitals Group     BP 08/31/21 1720 (!) 123/51     Pulse  Rate 08/31/21 1720 66     Resp 08/31/21 1720 20     Temp 08/31/21 1720 98.1 F (36.7 C)     Temp src --      SpO2 08/31/21 1720 100 %     Weight 08/31/21 1718 155 lb (70.3 kg)     Height 08/31/21 1718 '5\' 6"'$  (1.676 m)     Head Circumference --      Peak Flow --      Pain Score 08/31/21 1717 10     Pain Loc --      Pain Edu? --      Excl. in [place]? --    No data found.  Updated Vital Signs BP (!) 123/51 (BP Location: Left Arm)   Pulse 66   Temp 98.1 F (36.7 C)   Resp 20   Ht '5\' 6"'$  (1.676 m)   Wt 155 lb (70.3 kg)   SpO2 100%   BMI 25.02 kg/m   Visual Acuity Right Eye Distance:   Left Eye Distance:   Bilateral Distance:    Right Eye Near:   Left Eye Near:    Bilateral Near:     Physical Exam Vitals and nursing note reviewed.  Constitutional:      General: She is not in acute distress.    Appearance: She is not toxic-appearing.  HENT:     Right Ear: External ear normal.     Left Ear: External ear normal.  Eyes:     General: No scleral icterus.    Conjunctiva/sclera: Conjunctivae normal.  Pulmonary:     Effort: Pulmonary effort is normal.  Musculoskeletal:        General: Normal range of motion.     Cervical back: Neck supple.  Skin:    General: Skin is warm and dry.     Comments: L FOOT with erythema over distal 1st metatarsal head which is a little warm, and very tender. No open wounds noted on this foot.   Neurological:     Mental Status: She is alert and oriented to person, place, and time.     Gait: Gait abnormal.  Psychiatric:        Mood and Affect: Mood normal.        Behavior: Behavior normal.        Thought Content: Thought content normal.        Judgment: Judgment normal.     UC Treatments / Results  Labs (all labs ordered are listed, but only abnormal results are displayed) Labs Reviewed  URIC ACID - Abnormal; Notable for the following components:      Result Value   Uric Acid, Serum 7.4 (*)    All other components within normal  limits   CBC WITH DIFFERENTIAL/PLATELET    EKG   Radiology DG Foot Complete Left  Result Date: 08/31/2021 CLINICAL DATA:  Pain and swelling EXAM: LEFT FOOT - COMPLETE 3+ VIEW COMPARISON:  None Available. FINDINGS: No fracture or dislocation is seen. Degenerative changes are noted in the first metatarsophalangeal joint with bony spurs. Erosive change is seen in the medial aspect of head of the first metatarsal. There is soft tissue swelling over the first metatarsophalangeal joint. There are no abnormal soft tissue calcifications. IMPRESSION: No recent fracture or dislocation is seen. Erosions seen in the head of the left first metatarsal and soft tissue swelling adjacent to the first metatarsophalangeal joint suggest possible gout. Degenerative changes with small bony spurs are also noted in the first metatarsophalangeal joint. Electronically Signed   By: Elmer Picker M.D.   On: 08/31/2021 18:03    Procedures Procedures (including critical care time)  Medications Ordered in UC Medications - No data to display  Initial Impression / Assessment and Plan / UC Course  I have reviewed the triage vital signs and the nursing notes. Pertinent labs & imaging results that were available during my care of the patient were reviewed by me and considered in my medical decision making (see chart for details).  Has Acute Gout  Was placed on Colcrys and Indocin as noted.    Final Clinical Impressions(s) / UC Diagnoses   Final diagnoses:  Acute gout of foot, unspecified cause, unspecified laterality   Discharge Instructions   None    ED Prescriptions     Medication Sig Dispense Auth. Provider   colchicine 0.6 MG tablet Take 2 right away then in one hour take one for gout 3 tablet Rodriguez-Southworth, Sunday Spillers, PA-C   indomethacin (INDOCIN) 50 MG capsule Take 1 capsule (50 mg total) by mouth 2 (two) times daily with a meal. For pain and inflammation 14 capsule Rodriguez-Southworth, Sunday Spillers, PA-C       PDMP not reviewed this encounter.   Shelby Mattocks, [place] 08/31/21 2035

## 2021-08-31 NOTE — ED Triage Notes (Signed)
Patient c/o foot pain.  Patient denies any injury to the foot -- she stated " I cant remember if anything happened and if it sis it didn't hurt." Swollen and red.  Patient has been icing her foot -- no relief.

## 2021-09-10 ENCOUNTER — Encounter: Payer: Self-pay | Admitting: *Deleted

## 2021-09-13 ENCOUNTER — Encounter: Payer: Self-pay | Admitting: *Deleted

## 2021-09-13 ENCOUNTER — Ambulatory Visit: Payer: BC Managed Care – PPO | Admitting: Anesthesiology

## 2021-09-13 ENCOUNTER — Ambulatory Visit
Admission: RE | Admit: 2021-09-13 | Discharge: 2021-09-13 | Disposition: A | Discharge status: Home / Self Care | Payer: BC Managed Care – PPO | Attending: Gastroenterology | Admitting: Gastroenterology

## 2021-09-13 ENCOUNTER — Encounter
Admission: RE | Disposition: A | Discharge status: Home / Self Care | Payer: Self-pay | Source: Home / Self Care | Attending: Gastroenterology

## 2021-09-13 DIAGNOSIS — D124 Benign neoplasm of descending colon: Secondary | ICD-10-CM | POA: Insufficient documentation

## 2021-09-13 DIAGNOSIS — Z1211 Encounter for screening for malignant neoplasm of colon: Secondary | ICD-10-CM | POA: Insufficient documentation

## 2021-09-13 DIAGNOSIS — J449 Chronic obstructive pulmonary disease, unspecified: Secondary | ICD-10-CM | POA: Insufficient documentation

## 2021-09-13 DIAGNOSIS — E039 Hypothyroidism, unspecified: Secondary | ICD-10-CM | POA: Diagnosis not present

## 2021-09-13 DIAGNOSIS — I10 Essential (primary) hypertension: Secondary | ICD-10-CM | POA: Diagnosis not present

## 2021-09-13 DIAGNOSIS — Z86010 Personal history of colonic polyps: Secondary | ICD-10-CM | POA: Diagnosis not present

## 2021-09-13 DIAGNOSIS — F32A Depression, unspecified: Secondary | ICD-10-CM | POA: Insufficient documentation

## 2021-09-13 DIAGNOSIS — K5730 Diverticulosis of large intestine without perforation or abscess without bleeding: Secondary | ICD-10-CM | POA: Diagnosis not present

## 2021-09-13 DIAGNOSIS — G2581 Restless legs syndrome: Secondary | ICD-10-CM | POA: Diagnosis not present

## 2021-09-13 HISTORY — DX: Depression, unspecified: F32.A

## 2021-09-13 HISTORY — PX: COLONOSCOPY WITH PROPOFOL: SHX5780

## 2021-09-13 HISTORY — DX: Anxiety disorder, unspecified: F41.9

## 2021-09-13 HISTORY — DX: Chronic obstructive pulmonary disease, unspecified: J44.9

## 2021-09-13 SURGERY — COLONOSCOPY WITH PROPOFOL
Anesthesia: General

## 2021-09-13 MED ORDER — LIDOCAINE HCL (CARDIAC) PF 100 MG/5ML IV SOSY
PREFILLED_SYRINGE | INTRAVENOUS | Status: DC | PRN
  Administered 2021-09-13: 50 mg via INTRAVENOUS

## 2021-09-13 MED ORDER — PROPOFOL 10 MG/ML IV BOLUS
INTRAVENOUS | Status: DC | PRN
  Administered 2021-09-13: 50 mg via INTRAVENOUS

## 2021-09-13 MED ORDER — PROPOFOL 500 MG/50ML IV EMUL
INTRAVENOUS | Status: DC | PRN
  Administered 2021-09-13: 150 ug/kg/min via INTRAVENOUS

## 2021-09-13 MED ORDER — SODIUM CHLORIDE 0.9 % IV SOLN
INTRAVENOUS | Status: DC
  Administered 2021-09-13: 20 mL/h via INTRAVENOUS

## 2021-09-13 NOTE — Anesthesia Postprocedure Evaluation (Signed)
Anesthesia Post Note  Patient: Diane Glenn [facility]  Procedure(s) Performed: COLONOSCOPY WITH PROPOFOL  Patient location during evaluation: PACU Anesthesia Type: General Level of consciousness: awake and alert Pain management: pain level controlled Vital Signs Assessment: post-procedure vital signs reviewed and stable Respiratory status: spontaneous breathing, nonlabored ventilation, respiratory function stable and patient connected to nasal cannula oxygen Cardiovascular status: blood pressure returned to baseline and stable Postop Assessment: no apparent nausea or vomiting Anesthetic complications: no   No notable events documented.   Last Vitals:  Vitals:   09/13/21 1246 09/13/21 1256  BP: 132/60 (!) 151/70  Pulse: 71 66  Resp: 16 20  Temp:    SpO2: 100% 100%    Last Pain:  Vitals:   09/13/21 1256  TempSrc:   PainSc: 0-No pain                 [name]

## 2021-09-13 NOTE — Op Note (Signed)
[facility] Gastroenterology Patient Name: Diane Glenn Procedure Date: 09/13/2021 12:13 PM MRN: [id number] Account #: [id number] Date of Birth: 07/30/1950 Admit Type: Outpatient Age: 71 Room: [facility] ENDO ROOM 3 Gender: Female Note Status: Finalized Instrument Name: Peds Colonoscope [id number] Procedure:             Colonoscopy Indications:           Surveillance: Personal history of adenomatous polyps                         on last colonoscopy > 5 years ago Providers:             Andrey Farmer MD, MD Referring MD:          Colan Neptune. Fields (Referring MD) Medicines:             Monitored Anesthesia Care Complications:         No immediate complications. Estimated blood loss:                         Minimal. Procedure:             Pre-Anesthesia Assessment:                        - Prior to the procedure, a History and Physical was                         performed, and patient medications and allergies were                         reviewed. The patient is competent. The risks and                         benefits of the procedure and the sedation options and                         risks were discussed with the patient. All questions                         were answered and informed consent was obtained.                         Patient identification and proposed procedure were                         verified by the physician, the nurse, the                         anesthesiologist, the anesthetist and the technician                         in the endoscopy suite. Mental Status Examination:                         alert and oriented. Airway Examination: normal                         oropharyngeal airway and neck mobility. Respiratory  Examination: clear to auscultation. CV Examination:                         normal. Prophylactic Antibiotics: The patient does not                         require prophylactic antibiotics. Prior                          Anticoagulants: The patient has taken no previous                         anticoagulant or antiplatelet agents. ASA Grade                         Assessment: III - A patient with severe systemic                         disease. After reviewing the risks and benefits, the                         patient was deemed in satisfactory condition to                         undergo the procedure. The anesthesia plan was to use                         monitored anesthesia care (MAC). Immediately prior to                         administration of medications, the patient was                         re-assessed for adequacy to receive sedatives. The                         heart rate, respiratory rate, oxygen saturations,                         blood pressure, adequacy of pulmonary ventilation, and                         response to care were monitored throughout the                         procedure. The physical status of the patient was                         re-assessed after the procedure.                        After obtaining informed consent, the colonoscope was                         passed under direct vision. Throughout the procedure,                         the patient's blood pressure, pulse, and oxygen  saturations were monitored continuously. The                         Colonoscope was introduced through the anus and                         advanced to the the cecum, identified by appendiceal                         orifice and ileocecal valve. The colonoscopy was                         performed without difficulty. The patient tolerated                         the procedure well. The quality of the bowel                         preparation was good. Findings:      The perianal and digital rectal examinations were normal.      Multiple small-mouthed diverticula were found in the hepatic flexure and       ascending colon.      A 3 mm polyp was  found in the descending colon. The polyp was sessile.       The polyp was removed with a cold snare. Resection and retrieval were       complete. Estimated blood loss was minimal.      The exam was otherwise without abnormality on direct and retroflexion       views. Impression:            - Diverticulosis at the hepatic flexure and in the                         ascending colon.                        - One 3 mm polyp in the descending colon, removed with                         a cold snare. Resected and retrieved.                        - The examination was otherwise normal on direct and                         retroflexion views. Recommendation:        - Discharge patient to home.                        - Resume previous diet.                        - Continue present medications.                        - Await pathology results.                        - Repeat colonoscopy is not recommended due to current  age (10 years or older) for surveillance.                        - Return to referring physician as previously                         scheduled. Procedure Code(s):     --- Professional ---                        [phone], Colonoscopy, flexible; with removal of                         tumor(s), polyp(s), or other lesion(s) by snare                         technique Diagnosis Code(s):     --- Professional ---                        Z86.010, Personal history of colonic polyps                        K63.5, Polyp of colon                        K57.30, Diverticulosis of large intestine without                         perforation or abscess without bleeding CPT copyright 2019 American Medical Association. All rights reserved. The codes documented in this report are preliminary and upon coder review may  be revised to meet current compliance requirements. Andrey Farmer MD, MD 09/13/2021 12:35:28 PM Number of Addenda: 0 Note Initiated On: 09/13/2021 12:13  PM Scope Withdrawal Time: 0 hours 9 minutes 23 seconds  Total Procedure Duration: 0 hours 12 minutes 29 seconds  Estimated Blood Loss:  Estimated blood loss was minimal.      [facility]

## 2021-09-13 NOTE — Anesthesia Preprocedure Evaluation (Signed)
Anesthesia Evaluation  Patient identified by MRN, date of birth, ID band Patient awake    Reviewed: Allergy & Precautions, NPO status , Patient's Chart, lab work & pertinent test results  History of Anesthesia Complications Negative for: history of anesthetic complications  Airway Mallampati: III  TM Distance: <3 FB Neck ROM: full    Dental  (+) Chipped, Poor Dentition, Missing   Pulmonary COPD, Current Smoker and Patient abstained from smoking.,    Pulmonary exam normal        Cardiovascular Exercise Tolerance: Good hypertension, Normal cardiovascular exam     Neuro/Psych negative neurological ROS  negative psych ROS   GI/Hepatic negative GI ROS, Neg liver ROS, neg GERD  ,  Endo/Other  Hypothyroidism   Renal/GU negative Renal ROS  negative genitourinary   Musculoskeletal   Abdominal   Peds  Hematology negative hematology ROS (+)   Anesthesia Other Findings Past Medical History: No date: Anxiety No date: COPD (chronic obstructive pulmonary disease) (HCC) No date: Depression No date: Hypertension No date: Restless leg syndrome No date: Thyroid disease  Past Surgical History: 02/16/2017: BREAST BIOPSY; Left     Comment:  left stereo path pend 2013: COLONOSCOPY     Comment:  cleared for 10 yrs- Dr Vira Agar?  BMI    Body Mass Index: 25.40 kg/m      Reproductive/Obstetrics negative OB ROS                             Anesthesia Physical Anesthesia Plan  ASA: 3  Anesthesia Plan: General   Post-op Pain Management:    Induction: Intravenous  PONV Risk Score and Plan: Propofol infusion and TIVA  Airway Management Planned: Natural Airway and Nasal Cannula  Additional Equipment:   Intra-op Plan:   Post-operative Plan:   Informed Consent: I have reviewed the patients History and Physical, chart, labs and discussed the procedure including the risks, benefits and  alternatives for the proposed anesthesia with the patient or authorized representative who has indicated his/her understanding and acceptance.     Dental Advisory Given  Plan Discussed with: Anesthesiologist, CRNA and Surgeon  Anesthesia Plan Comments: (Patient consented for risks of anesthesia including but not limited to:  - adverse reactions to medications - risk of airway placement if required - damage to eyes, teeth, lips or other oral mucosa - nerve damage due to positioning  - sore throat or hoarseness - Damage to heart, brain, nerves, lungs, other parts of body or loss of life  Patient voiced understanding.)        Anesthesia Quick Evaluation

## 2021-09-13 NOTE — Interval H&P Note (Signed)
History and Physical Interval Note:  09/13/2021 12:13 PM  Diane Glenn [facility]  has presented today for surgery, with the diagnosis of history colon polyp.  The various methods of treatment have been discussed with the patient and family. After consideration of risks, benefits and other options for treatment, the patient has consented to  Procedure(s) with comments: COLONOSCOPY WITH PROPOFOL (N/A) - PM TIME REQUESTED as a surgical intervention.  The patient's history has been reviewed, patient examined, no change in status, stable for surgery.  I have reviewed the patient's chart and labs.  Questions were answered to the patient's satisfaction.     Diane Glenn  Ok to proceed with colonoscopy

## 2021-09-13 NOTE — Transfer of Care (Signed)
Immediate Anesthesia Transfer of Care Note  Patient: Diane Glenn [facility]  Procedure(s) Performed: COLONOSCOPY WITH PROPOFOL  Patient Location: PACU  Anesthesia Type:General  Level of Consciousness: awake  Airway & Oxygen Therapy: Patient Spontanous Breathing  Post-op Assessment: Report given to RN and Post -op Vital signs reviewed and stable  Post vital signs: Reviewed and stable  Last Vitals:  Vitals Value Taken Time  BP 96/47 09/13/21 1236  Temp 36.8 C 09/13/21 1236  Pulse 76 09/13/21 1236  Resp 20 09/13/21 1236  SpO2 100 % 09/13/21 1236    Last Pain:  Vitals:   09/13/21 1236  TempSrc: Temporal  PainSc: 0-No pain         Complications: No notable events documented.

## 2021-09-13 NOTE — H&P (Signed)
Outpatient short stay form Pre-procedure 09/13/2021  Diane Rubenstein, MD  Primary Physician: Peggye Form, NP  Reason for visit:  Surveillance  History of present illness:    71 y/o lady with history of COPD and hypothyroidism here for surveillance colonoscopy. Last colonoscopy in 2015 with adenomatous polyps. No blood thinners. No family history of GI malignancies. Denies any abdominal surgeries.    Current Facility-Administered Medications:    0.9 %  sodium chloride infusion, , Intravenous, Continuous, Kani Chauvin, Hilton Cork, MD, Last Rate: 20 mL/hr at 09/13/21 1152, 20 mL/hr at 09/13/21 1152  Medications Prior to Admission  Medication Sig Dispense Refill Last Dose   colchicine 0.6 MG tablet Take 2 right away then in one hour take one for gout 3 tablet 0 09/12/2021   cyclobenzaprine (FLEXERIL) 10 MG tablet One at night 30 tablet 6 09/12/2021   fluticasone (FLONASE) 50 MCG/ACT nasal spray 1 spray each nostril daily 48 mL 3 09/12/2021   gabapentin (NEURONTIN) 300 MG capsule TAKE 1 CAPSULE (300 MG TOTAL) BY MOUTH 3 (THREE) TIMES DAILY. (Patient taking differently: TAKE 1 CAPSULE (300 MG TOTAL) BY MOUTH 3 (THREE) TIMES DAILY./ neuro potter) 270 capsule 0 09/12/2021   indomethacin (INDOCIN) 50 MG capsule Take 1 capsule (50 mg total) by mouth 2 (two) times daily with a meal. For pain and inflammation 14 capsule 0 09/12/2021   levothyroxine (SYNTHROID) 50 MCG tablet Take by mouth.   09/12/2021   levothyroxine (SYNTHROID) 88 MCG tablet TAKE 1 TABLET BY MOUTH EVERY DAY 30 tablet 0 09/12/2021   metoprolol succinate (TOPROL-XL) 100 MG 24 hr tablet Take 1 tablet (100 mg total) by mouth daily. TAKE WITH OR IMMEDIATELY FOLLOWING A MEAL. 90 tablet 1 09/13/2021 at 0600   pramipexole (MIRAPEX) 0.5 MG tablet Take 1 tablet (0.5 mg total) by mouth daily. 90 tablet 1 09/12/2021   pramipexole (MIRAPEX) 1 MG tablet Take by mouth.   09/12/2021   rOPINIRole (REQUIP) 0.25 MG tablet TAKE 1 TABLET BY MOUTH EVERYDAY AT  BEDTIME 30 tablet 0 09/12/2021   triamcinolone cream (KENALOG) 0.1 % APPLY TO AFFECTED AREA TWICE A DAY 30 g 0 09/12/2021     Allergies  Allergen Reactions   Levofloxacin Other (See Comments)     Past Medical History:  Diagnosis Date   Anxiety    COPD (chronic obstructive pulmonary disease) (HCC)    Depression    Hypertension    Restless leg syndrome    Thyroid disease     Review of systems:  Otherwise negative.    Physical Exam  Gen: Alert, oriented. Appears stated age.  HEENT: PERRLA. Lungs: No respiratory distress CV: RRR Abd: soft, benign, no masses Ext: No edema    Planned procedures: Proceed with colonoscopy. The patient understands the nature of the planned procedure, indications, risks, alternatives and potential complications including but not limited to bleeding, infection, perforation, damage to internal organs and possible oversedation/side effects from anesthesia. The patient agrees and gives consent to proceed.  Please refer to procedure notes for findings, recommendations and patient disposition/instructions.     Diane Rubenstein, MD [facility]

## 2021-09-14 ENCOUNTER — Encounter: Payer: Self-pay | Admitting: Gastroenterology

## 2021-09-14 LAB — SURGICAL PATHOLOGY

## 2021-10-13 ENCOUNTER — Ambulatory Visit
Admission: RE | Admit: 2021-10-13 | Discharge: 2021-10-13 | Disposition: A | Discharge status: Home / Self Care | Payer: BC Managed Care – PPO | Source: Ambulatory Visit | Attending: Physician Assistant | Admitting: Physician Assistant

## 2021-10-13 ENCOUNTER — Other Ambulatory Visit: Payer: Self-pay | Admitting: Physician Assistant

## 2021-10-13 ENCOUNTER — Other Ambulatory Visit (HOSPITAL_COMMUNITY): Payer: Self-pay | Admitting: Physician Assistant

## 2021-10-13 DIAGNOSIS — G459 Transient cerebral ischemic attack, unspecified: Secondary | ICD-10-CM | POA: Insufficient documentation

## 2022-01-31 ENCOUNTER — Encounter (INDEPENDENT_AMBULATORY_CARE_PROVIDER_SITE_OTHER): Payer: Self-pay

## 2022-03-10 ENCOUNTER — Ambulatory Visit: Payer: BC Managed Care – PPO

## 2023-05-16 ENCOUNTER — Emergency Department
Admission: EM | Admit: 2023-05-16 | Discharge: 2023-05-16 | Disposition: A | Discharge status: Home / Self Care | Payer: BC Managed Care – PPO | Attending: Emergency Medicine | Admitting: Emergency Medicine

## 2023-05-16 ENCOUNTER — Other Ambulatory Visit: Payer: Self-pay

## 2023-05-16 ENCOUNTER — Encounter: Payer: Self-pay | Admitting: Emergency Medicine

## 2023-05-16 DIAGNOSIS — W01198A Fall on same level from slipping, tripping and stumbling with subsequent striking against other object, initial encounter: Secondary | ICD-10-CM | POA: Diagnosis not present

## 2023-05-16 DIAGNOSIS — Y9301 Activity, walking, marching and hiking: Secondary | ICD-10-CM | POA: Diagnosis not present

## 2023-05-16 DIAGNOSIS — Y92009 Unspecified place in unspecified non-institutional (private) residence as the place of occurrence of the external cause: Secondary | ICD-10-CM | POA: Insufficient documentation

## 2023-05-16 DIAGNOSIS — Z23 Encounter for immunization: Secondary | ICD-10-CM | POA: Insufficient documentation

## 2023-05-16 DIAGNOSIS — S59912A Unspecified injury of left forearm, initial encounter: Secondary | ICD-10-CM | POA: Diagnosis present

## 2023-05-16 DIAGNOSIS — S51812A Laceration without foreign body of left forearm, initial encounter: Secondary | ICD-10-CM | POA: Insufficient documentation

## 2023-05-16 MED ORDER — LIDOCAINE-EPINEPHRINE-TETRACAINE (LET) TOPICAL GEL
3.0000 mL | Freq: Once | TOPICAL | Status: AC
Start: 2023-05-16 — End: 2023-05-16
  Administered 2023-05-16: 3 mL via TOPICAL
  Filled 2023-05-16: qty 3

## 2023-05-16 MED ORDER — LIDOCAINE HCL (PF) 1 % IJ SOLN
5.0000 mL | Freq: Once | INTRAMUSCULAR | Status: AC
  Administered 2023-05-16: 5 mL
  Filled 2023-05-16: qty 5

## 2023-05-16 MED ORDER — TETANUS-DIPHTH-ACELL PERTUSSIS 5-2.5-18.5 LF-MCG/0.5 IM SUSY
0.5000 mL | PREFILLED_SYRINGE | Freq: Once | INTRAMUSCULAR | Status: AC
  Administered 2023-05-16: 0.5 mL via INTRAMUSCULAR
  Filled 2023-05-16: qty 0.5

## 2023-05-16 MED ORDER — LIDOCAINE VISCOUS HCL 2 % MT SOLN: 15.0000 mL | OROMUCOSAL | 0 refills | Status: AC | PRN

## 2023-05-16 NOTE — ED Triage Notes (Signed)
Patient to ED via POV for laceration to left arm. Pt states she slipped and fell while walking in house. Denies hitting head or LOC. No blood thinners. Bleeding controlled at this time.

## 2023-05-16 NOTE — ED Provider Notes (Signed)
[facility] Emergency Department Provider Note     Event Date/Time   First MD Initiated Contact with Patient 05/16/23 [phone]     (approximate)   History   Laceration   HPI  Diane Glenn is a 73 y.o. female with a noncontributory medical history, presents to the ED for evaluation of accidental laceration to the left arm.  Patient tripped and fell in the house, hitting her left forearm on the trash can.  She presents with a large skin tear to the left dorsal forearm.  She denies any head injury or LOC.  No reports of any wrist, elbow, or shoulder joint dysfunction at this time.  Patient reports an unknown tetanus status.   Physical Exam   Triage Vital Signs: ED Triage Vitals [05/16/23 1255]  Encounter Vitals Group     BP 105/81     Systolic BP Percentile      Diastolic BP Percentile      Pulse Rate 72     Resp 18     Temp 98.2 F (36.8 C)     Temp Source Oral     SpO2 95 %     Weight 160 lb (72.6 kg)     Height 5\' 6"  (1.676 m)     Head Circumference      Peak Flow      Pain Score 8     Pain Loc      Pain Education      Exclude from Growth Chart     Most recent vital signs: Vitals:   05/16/23 1255 05/16/23 1558  BP: 105/81 (!) 147/69  Pulse: 72 66  Resp: 18 18  Temp: 98.2 F (36.8 C)   SpO2: 95% 99%    General Awake, no distress. NAD HEENT NCAT. PERRL. EOMI. No rhinorrhea. Mucous membranes are moist.  CV:  Good peripheral perfusion. RRR RESP:  Normal effort. CTA ABD:  No distention.  MSK:  LLE with an irregular "Y" shaped skin tear laceration noted dorsally.  Bleeding is currently controlled.   ED Results / Procedures / Treatments   Labs (all labs ordered are listed, but only abnormal results are displayed) Labs Reviewed - No data to display   EKG   RADIOLOGY  No results found.   PROCEDURES:  Critical Care performed: No  .Laceration Repair  Date/Time: 05/16/2023 4:34 PM  Performed by: Lissa Hoard, PA-C Authorized by: Lissa Hoard, PA-C   Consent:    Consent obtained:  Verbal   Risks, benefits, and alternatives were discussed: yes     Risks discussed:  Pain and poor wound healing Universal protocol:    Site/side marked: yes     Patient identity confirmed:  Verbally with patient Anesthesia:    Anesthesia method:  Topical application   Topical anesthetic:  LET Laceration details:    Location:  Shoulder/arm   Shoulder/arm location:  L lower arm   Length (cm):  10   Depth (mm):  5 Pre-procedure details:    Preparation:  Patient was prepped and draped in usual sterile fashion Exploration:    Limited defect created (wound extended): no     Hemostasis achieved with:  Direct pressure and LET   Contaminated: no   Treatment:    Area cleansed with:  Povidone-iodine and saline   Amount of cleaning:  Standard   Irrigation solution:  Sterile saline   Irrigation volume:  10   Irrigation method:  Syringe  Debridement:  None   Undermining:  None   Scar revision: no   Skin repair:    Repair method:  Sutures and tissue adhesive   Suture size:  4-0   Suture material:  Nylon   Suture technique:  Simple interrupted   Number of sutures:  9 Approximation:    Approximation:  Close Repair type:    Repair type:  Intermediate Post-procedure details:    Dressing:  Non-adherent dressing and bulky dressing   Procedure completion:  Tolerated well, no immediate complications    MEDICATIONS ORDERED IN ED: Medications  lidocaine-EPINEPHrine-tetracaine (LET) topical gel (3 mLs Topical Given 05/16/23 1642)  Tdap (BOOSTRIX) injection 0.5 mL (0.5 mLs Intramuscular Given 05/16/23 1642)  lidocaine (PF) (XYLOCAINE) 1 % injection 5 mL (5 mLs Infiltration Given by Other 05/16/23 1737)     IMPRESSION / MDM / ASSESSMENT AND PLAN / ED COURSE  I reviewed the triage vital signs and the nursing notes.                              Differential diagnosis includes, but is not limited  to, laceration, abrasion, skin tear, hematoma, contusion  Patient's presentation is most consistent with acute, uncomplicated illness.  Patient's diagnosis is consistent with skin tear to the LUE.  Patient presents with a superficial skin tear to the left arm without other injury at this time.  She consents to wound repair which was performed using sutures.  Good wound edge approximation is achieved.  Patient will be discharged home with wound care instructions and supplies. Patient is to follow up with her PCP in 10 to 12 days as discussed, as needed or otherwise directed. Patient is given ED precautions to return to the ED for any worsening or new symptoms.   FINAL CLINICAL IMPRESSION(S) / ED DIAGNOSES   Final diagnoses:  Laceration of left forearm, initial encounter     Rx / DC Orders   ED Discharge Orders          Ordered    lidocaine (XYLOCAINE) 2 % solution  Every 4 hours PRN        05/16/23 1807             Note:  This document was prepared using Dragon voice recognition software and may include unintentional dictation errors.    Lissa Hoard, PA-C 05/16/23 1814    Dionne Bucy, MD 05/16/23 2318

## 2023-05-16 NOTE — ED Notes (Signed)
Pt had a fall. Wound to left forearm. Pt unsure what she hit her arm on.

## 2023-05-16 NOTE — Discharge Instructions (Signed)
Keep the wound clean, dry, and covered.  Cleanse daily with mild soap and water.  Avoid any alcohol or peroxide.  Avoid any lotions, creams, oils, ointments to the glued portion of the wound.  See your provider in 10 to 12 days for suture removal.

## 2023-09-22 NOTE — Progress Notes (Signed)
 History of Present Illness:   Diane Glenn is a 73 y.o. female here for   Verbally consented to the use of AI for note-taking.   Chief Complaint  Patient presents with  . Follow-up    Personal concerns     History of Present Illness Diane Glenn is a 73 year old female who presents with a persistent fatigue, cough, and nausea.  She has been experiencing a persistent cough for approximately two weeks, which began during a recent vacation. The cough is accompanied by significant congestion and production of thick, clear to white mucus. Despite using an expectorant, there has been no significant relief, and the cough is severe enough to wake her early in the morning.  She experiences persistent nausea and a significant loss of appetite, having consumed very little over the past week. She supplements her diet with nutritional drinks like Ensure and Pedialyte but manages only minimal solid food intake, such as toast. She feels lightheaded and nauseated throughout the day.  Her bowel movements have been infrequent, with dark stools noted, although they have improved slightly. She experiences occasional abdominal cramping and constipation, with bowel movements occurring once a day or less.  She has a history of urinary tract infection in May and currently experiences frequent urination without stinging.  She reports swelling in her legs, which has been persistent and noticeable. She experiences shortness of breath with minimal exertion, such as standing up or walking short distances. She is currently wearing a heart monitor and has a history of irregular heartbeats noted in a sleep study.  She has a long history of smoking, approximately fifty years, currently smoking about half a pack per day. She did not experience sunburn during her recent vacation as she did not spend time in the sun.     Past Medical History:   Past Medical History:  Diagnosis Date  . Anxiety Occasionally always   . Arthritis 2010   I just hurt so many places im assuming  that i have this  . Backache   . COPD (chronic obstructive pulmonary disease) (CMS/HHS-HCC)   . Cystitis   . Depression   . Diabetes mellitus without complication (CMS/HHS-HCC)    Possibly  . GERD (gastroesophageal reflux disease)   . Hematuria   . Hyperlipidemia   . Hypertension   . Hyperthyroidism   . Hypothyroidism   . Muscle weakness   . Osteoporosis 2010   Have been told i have  . RLS (restless legs syndrome)   . Sinusitis, unspecified   . Sleep apnea 2020   I think i might have  . Stomatitis   . Tobacco abuse   . Urinary frequency     Past Surgical History:   Past Surgical History:  Procedure Laterality Date  . COLONOSCOPY N/A 09/16/2013   Dr. FABIENE Holmes @ [facility] - Adenoamtous Polyp, rpt 5 yr sper RTE  . EGD N/A 09/16/2013   Dr. FABIENE Holmes @ [facility] [place] - No repeat per RTE  . Colon @ [facility]  09/13/2021   Sessile serrated polyp/PHx CP/No repeat due to age/CTL    Allergies:   Allergies  Allergen Reactions  . Levaquin  [Levofloxacin ] Muscle Pain    Current Medications:   Prior to Admission medications  Medication Sig Taking? Last Dose  albuterol MDI, PROVENTIL, VENTOLIN, PROAIR, HFA 90 mcg/actuation inhaler Inhale 2 inhalations into the lungs every 6 (six) hours as needed for Wheezing Yes Taking  fluticasone  propionate (FLONASE ) 50 mcg/actuation nasal spray Place 2 sprays into both  nostrils once daily Yes Taking  fluticasone -umeclidinium-vilanterol (TRELEGY ELLIPTA) 100-62.5-25 mcg inhaler Inhale 1 Puff into the lungs once daily Yes Taking  levothyroxine  (SYNTHROID ) 50 MCG tablet Take 1 tablet (50 mcg total) by mouth once daily Take on an empty stomach with a glass of water at least 30-60 minutes before breakfast. Yes Taking  metoprolol  SUCCinate (TOPROL -XL) 25 MG XL tablet Take 1 tablet (25 mg total) by mouth once daily Yes Taking  pramipexole  (MIRAPEX ) 1 MG tablet Take 1 mg at dinner and 2 mg at bedtime Yes  Taking  azithromycin  (ZITHROMAX ) 250 MG tablet 2 tabs on day one, then 1 tab daily x 4 days    benzonatate (TESSALON) 100 MG capsule Take 1 capsule (100 mg total) by mouth 3 (three) times daily as needed for Cough for up to 7 days    predniSONE (DELTASONE) 10 MG tablet 4,4,3,3,2,2,1,1    rosuvastatin (CRESTOR) 10 MG tablet Take 1 tablet (10 mg total) by mouth once daily    valsartan (DIOVAN) 80 MG tablet Take 1 tablet (80 mg total) by mouth once daily      Family History:   Family History  Problem Relation Name Age of Onset  . Alzheimer's disease Mother Tennie Hoffmann   . Anxiety Mother Tennie Hoffmann   . Depression Mother Tennie Hoffmann   . Hip fracture Mother Tennie Hoffmann   . Osteoarthritis Mother Tennie Hoffmann   . Osteoporosis (Thinning of bones) Mother Tennie Hoffmann   . Stroke Father Verl waymon   . Pneumonia Father Verl waymon   . Diabetes type II Father Verl waymon   . Asthma Brother [employer]   . Breast cancer Neg Hx      Social History:   Social History   Socioeconomic History  . Marital status: Married  Tobacco Use  . Smoking status: Every Day    Current packs/day: 1.00    Average packs/day: 1 pack/day for 50.0 years (50.0 ttl pk-yrs)    Types: Cigarettes    Passive exposure: Past  . Smokeless tobacco: Never  . Tobacco comments:    I want to quit , just makes me so nervous.  Vaping Use  . Vaping status: Every Day  Substance and Sexual Activity  . Alcohol use: Yes    Alcohol/week: 3.0 standard drinks of alcohol    Types: 3 Cans of beer per week    Comment: Only occasionally drink when friends pop in. Maybe twice a m  . Drug use: No  . Sexual activity: Not Currently    Partners: Male    Birth control/protection: Post-menopausal   Social Drivers of Health   Financial Resource Strain: Low Risk  (09/22/2023)   Overall Financial Resource Strain (CARDIA)   . Difficulty of Paying Living Expenses: Not hard at all  Food Insecurity: No Food Insecurity (09/22/2023)    Hunger Vital Sign   . Worried About Programme researcher, broadcasting/film/video in the Last Year: Never true   . Ran Out of Food in the Last Year: Never true  Transportation Needs: No Transportation Needs (09/22/2023)   PRAPARE - Transportation   . Lack of Transportation (Medical): No   . Lack of Transportation (Non-Medical): No  Housing Stability: Low Risk  (09/22/2023)   Housing Stability Vital Sign   . Unable to Pay for Housing in the Last Year: No   . Number of Times Moved in the Last Year: 0   . Homeless in the Last Year: No    Review of Systems:  A 10 point review of systems is negative, except for the pertinent positives and negatives detailed in the HPI.  Vitals:   Vitals:   09/22/23 1432  BP: (!) 142/86  Pulse: 61  SpO2: 97%  Weight: 74.4 kg (164 lb)  Height: 165.1 cm (5' 5)     Body mass index is 27.29 kg/m.  Physical Exam:   Physical Exam Vitals and nursing note reviewed.  Constitutional:      General: She is not in acute distress.    Appearance: Normal appearance. She is not ill-appearing, toxic-appearing or diaphoretic.  HENT:     Head: Normocephalic and atraumatic.     Right Ear: External ear normal.     Left Ear: External ear normal.     Nose: Congestion present. No rhinorrhea.     Mouth/Throat:     Mouth: Mucous membranes are moist.     Pharynx: Oropharynx is clear. Posterior oropharyngeal erythema present. No oropharyngeal exudate.   Eyes:     General:        Right eye: No discharge.        Left eye: No discharge.     Conjunctiva/sclera: Conjunctivae normal.    Cardiovascular:     Rate and Rhythm: Normal rate and regular rhythm.     Pulses: Normal pulses.     Heart sounds: Normal heart sounds. No murmur heard.    No friction rub. No gallop.  Pulmonary:     Effort: Pulmonary effort is normal. No respiratory distress.     Breath sounds: Normal breath sounds. No stridor. No wheezing, rhonchi or rales.  Chest:     Chest wall: No tenderness.   Skin:    General:  Skin is warm and dry.     Capillary Refill: Capillary refill takes less than 2 seconds.   Neurological:     Mental Status: She is alert.     Assessment and Plan:   Results for orders placed or performed in visit on 09/22/23  CBC w/auto Differential (5 Part)  Result Value Ref Range   WBC (White Blood Cell Count) 8.7 4.1 - 10.2 10^3/uL   RBC (Red Blood Cell Count) 4.37 4.04 - 5.48 10^6/uL   Hemoglobin 13.5 12.0 - 15.0 gm/dL   Hematocrit 61.2 64.9 - 47.0 %   MCV (Mean Corpuscular Volume) 88.6 80.0 - 100.0 fl   MCH (Mean Corpuscular Hemoglobin) 30.9 27.0 - 31.2 pg   MCHC (Mean Corpuscular Hemoglobin Concentration) 34.9 32.0 - 36.0 gm/dL   Platelet Count 644 849 - 450 10^3/uL   RDW-CV (Red Cell Distribution Width) 11.9 11.6 - 14.8 %   MPV (Mean Platelet Volume) 9.2 (L) 9.4 - 12.4 fl   Neutrophils 5.53 1.50 - 7.80 10^3/uL   Lymphocytes 2.12 1.00 - 3.60 10^3/uL   Monocytes 0.71 0.00 - 1.50 10^3/uL   Eosinophils 0.27 0.00 - 0.55 10^3/uL   Basophils 0.06 0.00 - 0.09 10^3/uL   Neutrophil % 63.3 32.0 - 70.0 %   Lymphocyte % 24.3 10.0 - 50.0 %   Monocyte % 8.1 4.0 - 13.0 %   Eosinophil % 3.1 1.0 - 5.0 %   Basophil% 0.7 0.0 - 2.0 %   Immature Granulocyte % 0.5 <=0.7 %   Immature Granulocyte Count 0.04 <=0.06 10^3/L  Comprehensive Metabolic Panel (CMP)  Result Value Ref Range   Glucose 96 70 - 110 mg/dL   Sodium 872 (L) 863 - 145 mmol/L   Potassium 4.2 3.6 - 5.1 mmol/L   Chloride 91 (L)  97 - 109 mmol/L   Carbon Dioxide (CO2) 30.4 22.0 - 32.0 mmol/L   Urea Nitrogen (BUN) 9 7 - 25 mg/dL   Creatinine 0.9 0.6 - 1.1 mg/dL   Glomerular Filtration Rate (eGFR) 68 >60 mL/min/1.73sq m   Calcium 9.6 8.7 - 10.3 mg/dL   AST  15 8 - 39 U/L   ALT  17 5 - 38 U/L   Alk Phos (alkaline Phosphatase) 68 34 - 104 U/L   Albumin 4.4 3.5 - 4.8 g/dL   Bilirubin, Total 0.5 0.3 - 1.2 mg/dL   Protein, Total 6.6 6.1 - 7.9 g/dL   A/G Ratio 2.0 1.0 - 5.0 gm/dL  Vitamin A87  Result Value Ref Range   Vitamin  B12 651 >300 pg/mL   Narrative   <200 pg/mL:    Low, consistent with Vitamin B12 Deficiency 200-300 pg/mL: Borderline, possible Vitamin B12 Deficiency >300 pg/mL:    Normal.  Vitamin B12 Deficiency is unlikely   Vitamin D, 25-Hydroxy - [facility]  Result Value Ref Range   Vitamin D, 25-Hydroxy - [facility] 42.5 30.0 - 100.0 ng/mL   Narrative   Performed at:  [address] - [facility] [address] [place], [place]  [id number] Lab Director: Frankey Sas MD, Phone:  [phone]  Urinalysis w/Microscopic  Result Value Ref Range   Color Colorless Colorless, Straw, Light Yellow, Yellow, Dark Yellow   Clarity Clear Clear   Specific Gravity 1.007 1.005 - 1.030   pH, Urine 7.0 5.0 - 8.0   Protein, Urinalysis Negative Negative mg/dL   Glucose, Urinalysis Negative Negative mg/dL   Ketones, Urinalysis Negative Negative mg/dL   Blood, Urinalysis Negative Negative   Nitrite, Urinalysis Negative Negative   Leukocyte Esterase, Urinalysis Negative Negative   Bilirubin, Urinalysis Negative Negative   Urobilinogen, Urinalysis 0.2 0.2 - 1.0 mg/dL   WBC, UA 1 <=5 /hpf   Red Blood Cells, Urinalysis 2 <=3 /hpf   Bacteria, Urinalysis 0-5 0 - 5 /hpf   Squamous Epithelial Cells, Urinalysis 4 /hpf  Amylase  Result Value Ref Range   Amylase 15 (L) 28 - 103 U/L  Lipase  Result Value Ref Range   Lipase 31 11 - 82 U/L    Diagnoses and all orders for this visit:  Nausea -     CBC w/auto Differential (5 Part) -     Comprehensive Metabolic Panel (CMP) -     Cancel: Thyroid  Stimulating-Hormone (TSH) -     Cancel: Thyroxine (T4), Free -     Vitamin B12 -     Vitamin D, 25-Hydroxy - [facility] -     Urinalysis w/Microscopic -     Urine Culture, Routine - [facility] -     Cancel: Triiodothyronine (T3), Total -     Amylase -     Lipase -     Ambulatory Referral to Gastroenterology  Dark stools -     CBC w/auto Differential (5 Part) -     Comprehensive Metabolic Panel (CMP) -     Cancel: Thyroid   Stimulating-Hormone (TSH) -     Cancel: Thyroxine (T4), Free -     Vitamin B12 -     Vitamin D, 25-Hydroxy - [facility] -     Urinalysis w/Microscopic -     Urine Culture, Routine - [facility] -     Cancel: Triiodothyronine (T3), Total -     Amylase -     Lipase -     Hemoccult ICT Diagnostic (x2 Home Collect); Future -  Ambulatory Referral to Gastroenterology  Dizziness -     CBC w/auto Differential (5 Part) -     Comprehensive Metabolic Panel (CMP) -     Cancel: Thyroid  Stimulating-Hormone (TSH) -     Cancel: Thyroxine (T4), Free -     Vitamin B12 -     Vitamin D, 25-Hydroxy - [facility] -     Urinalysis w/Microscopic -     Urine Culture, Routine - [facility] -     Cancel: Triiodothyronine (T3), Total  Primary hypertension  Chronic obstructive pulmonary disease with acute exacerbation (CMS/HHS-HCC)  Hypothyroidism, unspecified type  Mixed hyperlipidemia -     CBC w/auto Differential (5 Part) -     Comprehensive Metabolic Panel (CMP) -     Cancel: Thyroid  Stimulating-Hormone (TSH) -     Cancel: Thyroxine (T4), Free -     Vitamin B12 -     Vitamin D, 25-Hydroxy - [facility] -     Urinalysis w/Microscopic -     Urine Culture, Routine - [facility] -     Cancel: Triiodothyronine (T3), Total  Prediabetes -     CBC w/auto Differential (5 Part) -     Comprehensive Metabolic Panel (CMP) -     Cancel: Thyroid  Stimulating-Hormone (TSH) -     Cancel: Thyroxine (T4), Free -     Vitamin B12 -     Vitamin D, 25-Hydroxy - [facility] -     Urinalysis w/Microscopic -     Urine Culture, Routine - [facility] -     Cancel: Triiodothyronine (T3), Total  Tobacco abuse  RLS (restless legs syndrome) -     CBC w/auto Differential (5 Part) -     Comprehensive Metabolic Panel (CMP) -     Cancel: Thyroid  Stimulating-Hormone (TSH) -     Cancel: Thyroxine (T4), Free -     Vitamin B12 -     Vitamin D, 25-Hydroxy - [facility] -     Urinalysis w/Microscopic -     Urine Culture, Routine - [facility] -     Cancel:  Triiodothyronine (T3), Total  Excessive daytime sleepiness  Loss of appetite  Persistent cough -     X-ray chest PA and lateral; Future  Tobacco use -     CT lung cancer screening; Future  Cigarette smoker -     CT lung cancer screening; Future  Other orders -     valsartan (DIOVAN) 80 MG tablet; Take 1 tablet (80 mg total) by mouth once daily -     predniSONE (DELTASONE) 10 MG tablet; 4,4,3,3,2,2,1,1 -     azithromycin  (ZITHROMAX ) 250 MG tablet; 2 tabs on day one, then 1 tab daily x 4 days -     benzonatate (TESSALON) 100 MG capsule; Take 1 capsule (100 mg total) by mouth 3 (three) times daily as needed for Cough for up to 7 days    Assessment & Plan Bronchitis Experiencing congestion and a persistent cough with phlegm production since her recent vacation. The cough is severe enough to disrupt sleep and is associated with thick, clear to white mucus. Overuse of an expectorant may have led to lightheadedness. Concern exists for possible pulmonary fluid or infection. - Order a chest x-ray to evaluate for pulmonary fluid or infection. - Initiate antibiotic therapy for potential infection. - Prescribe antitussive tablets to manage symptoms.  Pruritic rash on back Presents with a pruritic rash on her back, consisting of small itchy bumps, which started before her recent vacation. Benadryl cream alleviates itching but  has not resolved the rash. - Prescribe a topical steroid cream to reduce inflammation and itching.  Nausea and loss of appetite Reports persistent nausea and significant loss of appetite, leading to minimal food intake over the past week. Nausea may be related to recent medication changes, including venlafaxine and valsartan-hydrochlorothiazide. Discontinuing valsartan-hydrochlorothiazide may reduce side effects. - Discontinue valsartan-hydrochlorothiazide and revert to regular valsartan. - Monitor symptoms post-medication adjustment. - Evaluate pancreatic enzymes to  rule out pancreatic causes of nausea. - Assess vitamin D and B12 levels.  Dark stools Reports dark stools, potentially indicating gastrointestinal bleeding. No abdominal pain, but occasional cramping and constipation are present. A colonoscopy is scheduled for July, but further evaluation is warranted. - Perform hemoccult test to detect blood in the stool. - Consider pelvic ultrasound if symptoms persist or worsen.  Leg swelling Significant leg swelling persists. Recent labs show no excess fluid retention. Valsartan-hydrochlorothiazide may contribute to symptoms. Compression stockings and leg elevation are recommended to improve circulation. - Discontinue valsartan-hydrochlorothiazide and revert to regular valsartan. - Recommend compression stockings to reduce swelling. - Encourage leg elevation when sitting to improve circulation.  General Health Maintenance Long-term smoker due for lung cancer screening. Scheduled for a colonoscopy in July. - Order low-dose CT scan for lung cancer screening. - Ensure follow-up for scheduled colonoscopy in July.  Follow-up - Schedule a follow-up appointment in one month to assess symptom improvement.  This note has been created using automated tools and reviewed for accuracy by provider.  Patient received an After Visit Summary    Attestation Statement:   I personally performed the service, non-incident to. (WP)   GLENDA MACARIO HADDOCK, NP

## 2023-09-28 ENCOUNTER — Other Ambulatory Visit: Payer: Self-pay | Admitting: Family Medicine

## 2023-09-28 DIAGNOSIS — F17210 Nicotine dependence, cigarettes, uncomplicated: Secondary | ICD-10-CM

## 2023-09-28 DIAGNOSIS — R110 Nausea: Secondary | ICD-10-CM

## 2023-09-28 DIAGNOSIS — Z720 Tobacco use: Secondary | ICD-10-CM

## 2023-10-12 NOTE — Progress Notes (Signed)
 History of Present Illness:   Diane Glenn is a 73 y.o. female here for   Verbally consented to the use of AI for note-taking.   Chief Complaint  Patient presents with  . Follow-up    History of Present Illness Diane Glenn is a 73 year old female who presents with generalized itching and rash.  She has been experiencing severe itching that has progressively worsened over the past three weeks, affecting her entire body, including her head and legs. The itching began approximately a week before her last visit on September 22, 2023.  She has tried various treatments without relief, including triamcinolone  cream, Benadryl, hydrocortisone, which she discontinued after a week due to lack of improvement.   No recent changes in soaps, lotions, detergents, or fabric softeners. She mentions a recent trip to the beach in mid-June but does not recall any symptoms starting during that time. Her husband, who has been in close contact, does not have a rash.  She has a history of thyroid  issues managed with thyroxine and has previously used prednisone for other conditions. She drinks approximately six Cokes a day and smokes.   Past Medical History:   Past Medical History:  Diagnosis Date  . Anxiety Occasionally always  . Arthritis 2010   I just hurt so many places im assuming  that i have this  . Backache   . COPD (chronic obstructive pulmonary disease) (CMS/HHS-HCC)   . Cystitis   . Depression   . Diabetes mellitus without complication (CMS/HHS-HCC)    Possibly  . GERD (gastroesophageal reflux disease)   . Hematuria   . Hyperlipidemia   . Hypertension   . Hyperthyroidism   . Hypothyroidism   . Muscle weakness   . Osteoporosis 2010   Have been told i have  . RLS (restless legs syndrome)   . Sinusitis, unspecified   . Sleep apnea 2020   I think i might have  . Stomatitis   . Tobacco abuse   . Urinary frequency     Past Surgical History:   Past Surgical History:  Procedure  Laterality Date  . COLONOSCOPY N/A 09/16/2013   Dr. FABIENE Holmes @ [facility] - Adenoamtous Polyp, rpt 5 yr sper RTE  . EGD N/A 09/16/2013   Dr. FABIENE Holmes @ [facility] - No repeat per RTE  . Colon @ [facility]  09/13/2021   Sessile serrated polyp/PHx CP/No repeat due to age/CTL    Allergies:   Allergies  Allergen Reactions  . Levaquin  [Levofloxacin ] Muscle Pain    Current Medications:   Prior to Admission medications  Medication Sig Taking? Last Dose  albuterol MDI, PROVENTIL, VENTOLIN, PROAIR, HFA 90 mcg/actuation inhaler Inhale 2 inhalations into the lungs every 6 (six) hours as needed for Wheezing Yes Taking  fluticasone  propionate (FLONASE ) 50 mcg/actuation nasal spray Place 2 sprays into both nostrils once daily Yes Taking  fluticasone -umeclidinium-vilanterol (TRELEGY ELLIPTA) 100-62.5-25 mcg inhaler Inhale 1 Puff into the lungs once daily Yes Taking  levothyroxine  (SYNTHROID ) 50 MCG tablet Take 1 tablet (50 mcg total) by mouth once daily Take on an empty stomach with a glass of water at least 30-60 minutes before breakfast. Yes Taking  metoprolol  SUCCinate (TOPROL -XL) 25 MG XL tablet Take 1 tablet (25 mg total) by mouth once daily Yes Taking  pramipexole  (MIRAPEX ) 1 MG tablet Take 1 mg at dinner and 2 mg at bedtime Yes Taking  predniSONE (DELTASONE) 10 MG tablet 4,4,3,3,2,2,1,1 Yes   rosuvastatin (CRESTOR) 10 MG tablet Take 1 tablet (10  mg total) by mouth once daily Yes Taking  valsartan (DIOVAN) 80 MG tablet Take 1 tablet (80 mg total) by mouth once daily Yes Taking  triamcinolone  0.1 % ointment Apply topically 2 (two) times daily      Family History:   Family History  Problem Relation Name Age of Onset  . Alzheimer's disease Mother Tennie Hoffmann   . Anxiety Mother Tennie Hoffmann   . Depression Mother Tennie Hoffmann   . Hip fracture Mother Tennie Hoffmann   . Osteoarthritis Mother Tennie Hoffmann   . Osteoporosis (Thinning of bones) Mother Tennie Hoffmann   . Stroke Father Verl waymon   . Pneumonia  Father Verl waymon   . Diabetes type II Father Verl waymon   . Asthma Brother [employer]   . Breast cancer Neg Hx      Social History:   Social History   Socioeconomic History  . Marital status: Married  Tobacco Use  . Smoking status: Every Day    Current packs/day: 1.00    Average packs/day: 1 pack/day for 50.0 years (50.0 ttl pk-yrs)    Types: Cigarettes    Passive exposure: Past  . Smokeless tobacco: Never  . Tobacco comments:    I want to quit , just makes me so nervous.  Vaping Use  . Vaping status: Every Day  Substance and Sexual Activity  . Alcohol use: Yes    Alcohol/week: 3.0 standard drinks of alcohol    Types: 3 Cans of beer per week    Comment: Only occasionally drink when friends pop in. Maybe twice a m  . Drug use: No  . Sexual activity: Not Currently    Partners: Male    Birth control/protection: Post-menopausal   Social Drivers of Health   Financial Resource Strain: Low Risk  (09/22/2023)   Overall Financial Resource Strain (CARDIA)   . Difficulty of Paying Living Expenses: Not hard at all  Food Insecurity: No Food Insecurity (09/22/2023)   Hunger Vital Sign   . Worried About Programme researcher, broadcasting/film/video in the Last Year: Never true   . Ran Out of Food in the Last Year: Never true  Transportation Needs: No Transportation Needs (09/22/2023)   PRAPARE - Transportation   . Lack of Transportation (Medical): No   . Lack of Transportation (Non-Medical): No  Housing Stability: Low Risk  (09/22/2023)   Housing Stability Vital Sign   . Unable to Pay for Housing in the Last Year: No   . Number of Times Moved in the Last Year: 0   . Homeless in the Last Year: No    Review of Systems:   A 10 point review of systems is negative, except for the pertinent positives and negatives detailed in the HPI.  Vitals:   Vitals:   10/12/23 1530  BP: 124/72  Pulse: 83  SpO2: 99%  Weight: 71.7 kg (158 lb)  Height: 165.1 cm (5' 5)     Body mass index is 26.29  kg/m.  Physical Exam:   Physical Exam Vitals and nursing note reviewed.  Constitutional:      General: She is not in acute distress.    Appearance: Normal appearance. She is not ill-appearing, toxic-appearing or diaphoretic.  HENT:     Head: Normocephalic and atraumatic.     Right Ear: External ear normal.     Left Ear: External ear normal.   Eyes:     General:        Right eye: No discharge.  Left eye: No discharge.     Conjunctiva/sclera: Conjunctivae normal.    Cardiovascular:     Rate and Rhythm: Normal rate and regular rhythm.     Pulses: Normal pulses.     Heart sounds: Normal heart sounds. No murmur heard.    No friction rub. No gallop.  Pulmonary:     Effort: Pulmonary effort is normal. No respiratory distress.     Breath sounds: Normal breath sounds. No stridor. No wheezing, rhonchi or rales.  Chest:     Chest wall: No tenderness.   Skin:    General: Skin is warm and dry.     Capillary Refill: Capillary refill takes less than 2 seconds.     Findings: Rash present. Rash is macular.       Neurological:     Mental Status: She is alert.     Assessment and Plan:  No results found for this visit on 10/12/23.  Diagnoses and all orders for this visit:  Macular rash -     triamcinolone  acetonide (KENALOG -40) injection 60 mg  Other orders -     predniSONE (DELTASONE) 10 MG tablet; 4,4,3,3,2,2,1,1   Assessment & Plan Pruritic rash Persistent pruritic rash worsening over three weeks, generalized with palpable small bumps on head and legs. No changes in soaps, lotions, or detergents. Over-the-counter creams and prescription treatments, including triamcinolone  ointment and prednisone, were ineffective. Differential diagnosis includes allergic reaction, contact dermatitis, or systemic causes, though liver, kidney, and thyroid  functions are normal. Hypothyroidism managed with levothyroxine . Discussed potential benefit of prednisone injection and oral  prednisone for inflammation and itching. Benadryl may aid sleep but has limited effect on itching. - Administer Kenalog  injection. - Prescribe oral prednisone to start the following morning. - Advise Benadryl at bedtime to aid sleep/itching.  General Health Maintenance Reports high soda consumption and minimal water intake, impacting hydration and overall health. Encouraged to increase water intake and reduce soda consumption. - Encourage increased water intake and reduction of soda consumption.   Follow up She will call the office with any new or worsening symptoms.   This note has been created using automated tools and reviewed for accuracy by provider.  Patient received an After Visit Summary    Attestation Statement:   I personally performed the service, non-incident to. (WP)   GLENDA MACARIO HADDOCK, NP

## 2023-10-16 ENCOUNTER — Ambulatory Visit
Admission: RE | Admit: 2023-10-16 | Discharge: 2023-10-16 | Disposition: A | Discharge status: Home / Self Care | Source: Ambulatory Visit | Attending: Family Medicine | Admitting: Family Medicine

## 2023-10-16 DIAGNOSIS — F17210 Nicotine dependence, cigarettes, uncomplicated: Secondary | ICD-10-CM | POA: Diagnosis present

## 2023-10-16 DIAGNOSIS — R110 Nausea: Secondary | ICD-10-CM | POA: Diagnosis present

## 2023-10-16 DIAGNOSIS — Z720 Tobacco use: Secondary | ICD-10-CM | POA: Insufficient documentation
